# Patient Record
Sex: Male | Born: 1985 | Race: Black or African American | Hispanic: No | Marital: Married | Smoking: Never smoker
Health system: Southern US, Community
[De-identification: ages and names within clinical notes are randomized; demographics above are authoritative.]

## PROBLEM LIST (undated history)

## (undated) DIAGNOSIS — I10 Essential (primary) hypertension: Secondary | ICD-10-CM

---
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
## 2007-06-02 ENCOUNTER — Emergency Department (HOSPITAL_COMMUNITY): Admission: EM | Admit: 2007-06-02 | Discharge: 2007-06-03 | Payer: Self-pay | Admitting: Emergency Medicine

## 2010-10-11 LAB — INFLUENZA A+B VIRUS AG-DIRECT(RAPID)
Inflenza A Ag: NEGATIVE
Influenza B Ag: NEGATIVE

## 2010-10-11 LAB — RAPID STREP SCREEN (MED CTR MEBANE ONLY): Streptococcus, Group A Screen (Direct): NEGATIVE

## 2015-09-12 ENCOUNTER — Ambulatory Visit (INDEPENDENT_AMBULATORY_CARE_PROVIDER_SITE_OTHER): Payer: BLUE CROSS/BLUE SHIELD | Admitting: Family Medicine

## 2015-09-12 VITALS — BP 132/92 | HR 66 | Temp 98.1°F | Resp 16 | Ht 71.0 in | Wt 239.0 lb

## 2015-09-12 DIAGNOSIS — R51 Headache: Secondary | ICD-10-CM | POA: Diagnosis not present

## 2015-09-12 DIAGNOSIS — R519 Headache, unspecified: Secondary | ICD-10-CM

## 2015-09-12 MED ORDER — NAPROXEN 500 MG PO TABS
500.0000 mg | ORAL_TABLET | Freq: Two times a day (BID) | ORAL | 0 refills | Status: DC
Start: 2015-09-12 — End: 2016-03-19

## 2015-09-12 MED ORDER — TIZANIDINE HCL 6 MG PO CAPS: 6.0000 mg | ORAL_CAPSULE | Freq: Three times a day (TID) | ORAL | 0 refills | Status: DC

## 2015-09-12 NOTE — Patient Instructions (Addendum)
The headache clinic will call to schedule your appointment. Take as follows: . . tizanidine (ZANAFLEX) 6 MG capsule muscle relaxant for headache    Sig: Take 1 capsule (6 mg total) by mouth 3 (three) times daily.  . naproxen (NAPROSYN) 500 MG tablet anti-inflammatory for headache    Sig: Take 1 tablet (500 mg total) by mouth 2 (two) times daily with a meal.   Follow-up if headache still persists after 24 hours.  Godfrey PickKimberly S. Tiburcio PeaHarris, MSN, FNP-C [facility]  IF you received an x-ray today, you will receive an invoice from [facility]. Please contact [facility] at [phone] with questions or concerns regarding your invoice.   IF you received labwork today, you will receive an invoice from [facility]. Please contact [facility] at [phone] with questions or concerns regarding your invoice.   Our billing staff will not be able to assist you with questions regarding bills from these companies.  You will be contacted with the lab results as soon as they are available. The fastest way to get your results is to activate your My Chart account. Instructions are located on the last page of this paperwork. If you have not heard from us regarding the results in 2 weeks, please contact this office.     General Headache Without Cause A headache is pain or discomfort felt around the head or neck area. There are many causes and types of headaches. In some cases, the cause may not be found.  HOME CARE  Managing Pain  Take over-the-counter and prescription medicines only as told by your doctor.  Lie down in a dark, quiet room when you have a headache.  If directed, apply ice to the head and neck area:  Put ice in a plastic bag.  Place a towel between your skin and the bag.  Leave the ice on for 20 minutes, 2-3 times per day.  Use a heating pad or hot shower to apply heat to the head and neck area as told  by your doctor.  Keep lights dim if bright lights bother you or make your headaches worse. Eating and Drinking  Eat meals on a regular schedule.  Lessen how much alcohol you drink.  Lessen how much caffeine you drink, or stop drinking caffeine. General Instructions  Keep all follow-up visits as told by your doctor. This is important.  Keep a journal to find out if certain things bring on headaches. For example, write down:  What you eat and drink.  How much sleep you get.  Any change to your diet or medicines.  Relax by getting a massage or doing other relaxing activities.  Lessen stress.  Sit up straight. Do not tighten (tense) your muscles.  Do not use tobacco products. This includes cigarettes, chewing tobacco, or e-cigarettes. If you need help quitting, ask your doctor.  Exercise regularly as told by your doctor.  Get enough sleep. This often means 7-9 hours of sleep. GET HELP IF:  Your symptoms are not helped by medicine.  You have a headache that feels different than the other headaches.  You feel sick to your stomach (nauseous) or you throw up (vomit).  You have a fever. GET HELP RIGHT AWAY IF:   Your headache becomes really bad.  You keep throwing up.  You have a stiff neck.  You have trouble seeing.  You have trouble speaking.  You have pain in the eye or ear.  Your  muscles are weak or you lose muscle control.  You lose your balance or have trouble walking.  You feel like you will pass out (faint) or you pass out.  You have confusion.   This information is not intended to replace advice given to you by your health care provider. Make sure you discuss any questions you have with your health care provider.   Document Released: 10/11/2007 Document Revised: 09/22/2014 Document Reviewed: 04/26/2014 Elsevier Interactive Patient Education [employer]

## 2015-09-12 NOTE — Progress Notes (Signed)
Patient ID: Gary Soto, male    DOB: 1985-03-26, 30 y.o.   MRN: [id number]  PCP: No primary care provider on file.  Chief Complaint  Patient presents with  . Headache    x 2 days    Subjective:   HPI Presents for evaluation of headache.  30 year old male presents with a headache 2 nights.   He reports that his headache started as a mild ache and has progressively gotten worse while taking taking excedrin migraine.  He reports that he normally takes excedrin migraine anytime he gets a headache and it normally alleviates it. He reports a sensitivity to light, noise, and odors.  He reports the headache is located in right temporal region and has a pulsating sensation. He reports nausea without vomiting. He reports having a decreased appetite compared to his normal appetite. Describes lightheadedness but no real dizziness. No changes in speech or tremors.  Patient reports that he has been having these headaches for several years and has never had a complete workup for his headaches.  . Social History   Social History  . Marital status: Single    Spouse name: N/A  . Number of children: N/A  . Years of education: N/A   Occupational History  . Not on file.   Social History Main Topics  . Smoking status: Never Smoker  . Smokeless tobacco: Never Used  . Alcohol use Yes     Comment: once alcoholic beverage per week  . Drug use: No  . Sexual activity: Not on file   Other Topics Concern  . Not on file   Social History Narrative  . No narrative on file    . Family History  Problem Relation Age of Onset  . Hypertension Mother   . Hyperlipidemia Mother   . Hypertension Maternal Grandmother   . Hyperlipidemia Maternal Grandmother   . Diabetes Maternal Grandmother   . Heart disease Maternal Grandmother    Review of Systems  Constitutional: Positive for appetite change and fatigue.  Respiratory: Negative.   Cardiovascular: Negative.   Neurological: Positive for  light-headedness and headaches.    There are no active problems to display for this patient.    Prior to Admission medications   Not on File     No Known Allergies     Objective:  Physical Exam  Constitutional: He is oriented to person, place, and time. He appears well-developed and well-nourished.  HENT:  Head: Normocephalic and atraumatic.  Right Ear: External ear normal.  Left Ear: External ear normal.  Eyes: Conjunctivae and EOM are normal. Pupils are equal, round, and reactive to light.  Neck: Normal range of motion. Neck supple.  Cardiovascular: Normal rate, regular rhythm, normal heart sounds and intact distal pulses.   Pulmonary/Chest: Breath sounds normal.  Musculoskeletal: Normal range of motion.  Neurological: He is alert and oriented to person, place, and time. He has normal reflexes.  Cerebellar function intact. Negative for nystagmus  Skin: Skin is warm and dry.  Psychiatric: He has a normal mood and affect. His behavior is normal. Judgment and thought content normal.    Vitals:   09/12/15 1631  BP: (!) 132/92  Pulse: 66  Resp: 16  Temp: 98.1 F (36.7 C)   Assessment & Plan:  .1. Headache, unspecified headache type - AMB referral to headache clinic . . tizanidine (ZANAFLEX) 6 MG capsule    Sig: Take 1 capsule (6 mg total) by mouth 3 (three) times daily.  . naproxen (NAPROSYN) 500  MG tablet    Sig: Take 1 tablet (500 mg total) by mouth 2 (two) times daily with a meal.   Follow-up as needed.  Return for care if headache persists beyond 24 hours adhering  to prescribed regimen.  Godfrey PickKimberly S. Tiburcio PeaHarris, MSN, FNP-C [facility]

## 2015-11-12 ENCOUNTER — Emergency Department (HOSPITAL_COMMUNITY)
Admission: EM | Admit: 2015-11-12 | Discharge: 2015-11-13 | Disposition: A | Discharge status: Home / Self Care | Payer: Self-pay | Attending: Emergency Medicine | Admitting: Emergency Medicine

## 2015-11-12 DIAGNOSIS — F1994 Other psychoactive substance use, unspecified with psychoactive substance-induced mood disorder: Secondary | ICD-10-CM

## 2015-11-12 DIAGNOSIS — T50905A Adverse effect of unspecified drugs, medicaments and biological substances, initial encounter: Secondary | ICD-10-CM

## 2015-11-12 DIAGNOSIS — Z79899 Other long term (current) drug therapy: Secondary | ICD-10-CM | POA: Insufficient documentation

## 2015-11-12 DIAGNOSIS — T887XXA Unspecified adverse effect of drug or medicament, initial encounter: Secondary | ICD-10-CM | POA: Insufficient documentation

## 2015-11-12 DIAGNOSIS — R45851 Suicidal ideations: Secondary | ICD-10-CM | POA: Insufficient documentation

## 2015-11-12 DIAGNOSIS — F919 Conduct disorder, unspecified: Secondary | ICD-10-CM | POA: Insufficient documentation

## 2015-11-12 DIAGNOSIS — T407X5A Adverse effect of cannabis (derivatives), initial encounter: Secondary | ICD-10-CM | POA: Insufficient documentation

## 2015-11-12 DIAGNOSIS — Y829 Unspecified medical devices associated with adverse incidents: Secondary | ICD-10-CM | POA: Insufficient documentation

## 2015-11-12 LAB — RAPID URINE DRUG SCREEN, HOSP PERFORMED
Amphetamines: NOT DETECTED
BARBITURATES: NOT DETECTED
BENZODIAZEPINES: NOT DETECTED
COCAINE: NOT DETECTED
Opiates: NOT DETECTED
TETRAHYDROCANNABINOL: POSITIVE — AB

## 2015-11-12 NOTE — ED Provider Notes (Addendum)
[facility]-EMERGENCY DEPT Provider Note   CSN: [id number] Arrival date & time: 11/12/15  2208     History   Chief Complaint Chief Complaint  Patient presents with  . Suicidal    HPI Gary Soto is a 30 y.o. male. He presents with abnormal behavior after ingesting a marijuana brownie.  Using marijuana once in the past and had euphoric effects with it. He did not hallucinate. He ate a brownie tonight, as did his wife. He states that he started having a bad reaction and feeling like things were "awful and terrible. Systolic he was going to die. He states he felt like he had things crawling all over him. He became very agitated and asked his wife to take the kids out of the house as he was afraid that he will hurt her over them. He remembers this. He does not recall thinking that he wanted to. He was just really concerned about his abnormal behavior he was experiencing from the marijuana.   Apparently his wife was really concerned about his agitation episode and is placed him under IVC. Patient now is awake alert communicated with me. States he feels better. States he feels very tired after the episode.     HPI  No past medical history on file.  There are no active problems to display for this patient.   No past surgical history on file.     Home Medications    Prior to Admission medications   Medication Sig Start Date End Date Taking? Authorizing Provider  naproxen (NAPROSYN) 500 MG tablet Take 1 tablet (500 mg total) by mouth 2 (two) times daily with a meal. 09/12/15   Doyle AskewKimberly Stephenia Harris, FNP  tizanidine (ZANAFLEX) 6 MG capsule Take 1 capsule (6 mg total) by mouth 3 (three) times daily. 09/12/15   Doyle AskewKimberly Stephenia Harris, FNP    Family History Family History  Problem Relation Age of Onset  . Hypertension Mother   . Hyperlipidemia Mother   . Hypertension Maternal Grandmother   . Hyperlipidemia Maternal Grandmother   . Diabetes Maternal Grandmother   . Heart  disease Maternal Grandmother     Social History Social History  Substance Use Topics  . Smoking status: Never Smoker  . Smokeless tobacco: Never Used  . Alcohol use Yes     Comment: once alcoholic beverage per week     Allergies   Review of patient's allergies indicates no known allergies.   Review of Systems Review of Systems  Constitutional: Negative for appetite change, chills, diaphoresis, fatigue and fever.  HENT: Negative for mouth sores, sore throat and trouble swallowing.   Eyes: Negative for visual disturbance.  Respiratory: Negative for cough, chest tightness, shortness of breath and wheezing.   Cardiovascular: Negative for chest pain.  Gastrointestinal: Negative for abdominal distention, abdominal pain, diarrhea, nausea and vomiting.  Endocrine: Negative for polydipsia, polyphagia and polyuria.  Genitourinary: Negative for dysuria, frequency and hematuria.  Musculoskeletal: Negative for gait problem.  Skin: Negative for color change, pallor and rash.  Neurological: Negative for dizziness, syncope, light-headedness and headaches.  Hematological: Does not bruise/bleed easily.  Psychiatric/Behavioral: Negative for behavioral problems and confusion.     Physical Exam Updated Vital Signs BP 152/92 (BP Location: Right Arm)   Pulse 92   Temp 98.4 F (36.9 C) (Oral)   Resp 18   SpO2 100%   Physical Exam  Constitutional: He is oriented to person, place, and time. He appears well-developed and well-nourished. No distress.  HENT:  Head: Normocephalic.  Eyes: Conjunctivae are normal. Pupils are equal, round, and reactive to light. No scleral icterus.  Neck: Normal range of motion. Neck supple. No thyromegaly present.  Cardiovascular: Normal rate and regular rhythm.  Exam reveals no gallop and no friction rub.   No murmur heard. Pulmonary/Chest: Effort normal and breath sounds normal. No respiratory distress. He has no wheezes. He has no rales.  Abdominal: Soft.  Bowel sounds are normal. He exhibits no distension. There is no tenderness. There is no rebound.  Musculoskeletal: Normal range of motion.  Neurological: He is alert and oriented to person, place, and time.  Skin: Skin is warm and dry. No rash noted.  Psychiatric: He has a normal mood and affect. His behavior is normal.     ED Treatments / Results  Labs (all labs ordered are listed, but only abnormal results are displayed) Labs Reviewed  RAPID URINE DRUG SCREEN, HOSP PERFORMED - Abnormal; Notable for the following:       Result Value   Tetrahydrocannabinol POSITIVE (*)    All other components within normal limits    EKG  EKG Interpretation None       Radiology No results found.  Procedures Procedures (including critical care time)  Medications Ordered in ED Medications - No data to display   Initial Impression / Assessment and Plan / ED Course  I have reviewed the triage vital signs and the nursing notes.  Pertinent labs & imaging results that were available during my care of the patient were reviewed by me and considered in my medical decision making (see chart for details).  Clinical Course    Only on his toxicology. We'll asked for screening labs and TTS evaluation. Patient seems clear and does not currently meet criteria for involuntary hold on my evaluation. I feel this was drug effect which has passed  Final Clinical Impressions(s) / ED Diagnoses   Final diagnoses:  Adverse effect of drug, initial encounter    New Prescriptions New Prescriptions   No medications on file     Patient seen and evaluated by a change Center in agreement patient had an adverse drug reaction and meets no criteria for inpatient treatment at this time. He is awake alert, lucid oriented denies being homicidal or suicidal.  Rolland PorterMark Trinaty Bundrick, MD 11/12/15 2356    Rolland PorterMark Khamryn Calderone, MD 11/13/15 [phone]

## 2015-11-12 NOTE — ED Notes (Signed)
Bed: [id number] Expected date:  Expected time:  Means of arrival:  Comments: 30yo SI

## 2015-11-12 NOTE — ED Notes (Signed)
Per EMS- Pt ate brownies with unknown drugs in them, wanted to hurt himself and others. Conscious but nonverbal.

## 2015-11-13 DIAGNOSIS — Z833 Family history of diabetes mellitus: Secondary | ICD-10-CM

## 2015-11-13 DIAGNOSIS — F1994 Other psychoactive substance use, unspecified with psychoactive substance-induced mood disorder: Secondary | ICD-10-CM

## 2015-11-13 DIAGNOSIS — Z8249 Family history of ischemic heart disease and other diseases of the circulatory system: Secondary | ICD-10-CM

## 2015-11-13 DIAGNOSIS — Z79899 Other long term (current) drug therapy: Secondary | ICD-10-CM

## 2015-11-13 DIAGNOSIS — Z8349 Family history of other endocrine, nutritional and metabolic diseases: Secondary | ICD-10-CM

## 2015-11-13 LAB — ETHANOL: Alcohol, Ethyl (B): <5 mg/dL (ref <5)

## 2015-11-13 LAB — CBC WITH DIFFERENTIAL/PLATELET
BASOS PCT: 0 %
Basophils Absolute: 0 10*3/uL (ref 0.0–0.1)
EOS ABS: 0 10*3/uL (ref 0.0–0.7)
Eosinophils Relative: 0 %
HEMATOCRIT: 42.6 % (ref 39.0–52.0)
HEMOGLOBIN: 14.9 g/dL (ref 13.0–17.0)
Lymphocytes Relative: 12 %
Lymphs Abs: 1.5 10*3/uL (ref 0.7–4.0)
MCH: 30 pg (ref 26.0–34.0)
MCHC: 35 g/dL (ref 30.0–36.0)
MCV: 85.7 fL (ref 78.0–100.0)
Monocytes Absolute: 0.7 10*3/uL (ref 0.1–1.0)
Monocytes Relative: 6 %
NEUTROS ABS: 9.8 10*3/uL — AB (ref 1.7–7.7)
NEUTROS PCT: 82 %
Platelets: 216 10*3/uL (ref 150–400)
RBC: 4.97 MIL/uL (ref 4.22–5.81)
RDW: 13 % (ref 11.5–15.5)
WBC: 11.9 10*3/uL — AB (ref 4.0–10.5)

## 2015-11-13 LAB — URINALYSIS, ROUTINE W REFLEX MICROSCOPIC
GLUCOSE, UA: 100 mg/dL — AB
Hgb urine dipstick: NEGATIVE
Ketones, ur: NEGATIVE mg/dL
Leukocytes, UA: NEGATIVE
NITRITE: NEGATIVE
PH: 6 (ref 5.0–8.0)
Protein, ur: 30 mg/dL — AB
SPECIFIC GRAVITY, URINE: 1.036 — AB (ref 1.005–1.030)

## 2015-11-13 LAB — COMPREHENSIVE METABOLIC PANEL
ALBUMIN: 4.3 g/dL (ref 3.5–5.0)
ALK PHOS: 51 U/L (ref 38–126)
ALT: 95 U/L — AB (ref 17–63)
ANION GAP: 6 (ref 5–15)
AST: 125 U/L — AB (ref 15–41)
BILIRUBIN TOTAL: 0.8 mg/dL (ref 0.3–1.2)
BUN: 17 mg/dL (ref 6–20)
CALCIUM: 9 mg/dL (ref 8.9–10.3)
CO2: 25 mmol/L (ref 22–32)
CREATININE: 1.23 mg/dL (ref 0.61–1.24)
Chloride: 106 mmol/L (ref 101–111)
GFR calc Af Amer: >60 mL/min (ref >60)
GFR calc non Af Amer: >60 mL/min (ref >60)
GLUCOSE: 117 mg/dL — AB (ref 65–99)
Potassium: 4.3 mmol/L (ref 3.5–5.1)
SODIUM: 137 mmol/L (ref 135–145)
Total Protein: 7.5 g/dL (ref 6.5–8.1)

## 2015-11-13 LAB — URINE MICROSCOPIC-ADD ON

## 2015-11-13 NOTE — ED Notes (Signed)
Pt ambulatory and discharged home with wife. All personal belongings returned to pt. Follow up instructions as well as resources given with pt verbalizing understanding. Pt encouraged to return for SI/HI or changes/worsening in condition. Contracted for safety.

## 2015-11-13 NOTE — ED Notes (Signed)
OP resources give to pt by CSW

## 2015-11-13 NOTE — Progress Notes (Signed)
CSW spoke with patient at beside. CSW discussed substance abuse treatment options with patient, specifically informing patient about [facility]. CSW inquired if patient had any questions or concerns, patient replied no. CSW provided patient with substance abuse treatment options handout with contact information for facilities discussed and additional facilities within the area.

## 2015-11-13 NOTE — ED Notes (Signed)
SBAR Report received from previous nurse. Pt received calm and visible on unit.  Pt reminded of camera surveillance, q 15 min rounds, and rules of the milieu. Pt screened for contraband by Clinical research associatewriter, will endorse to day shift.

## 2015-11-13 NOTE — ED Notes (Signed)
Up to call for a ride

## 2015-11-13 NOTE — ED Notes (Signed)
Pt's wife unable to pick him up until later today,  But will try find someone else to pick him up

## 2015-11-13 NOTE — ED Notes (Signed)
Pt's wife is here to pick  Him up

## 2015-11-13 NOTE — ED Notes (Signed)
Pt belongings in bag and wanded by security
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
## 2015-11-13 NOTE — [facility] Suicide Risk Assessment (Signed)
Suicide Risk Assessment  Discharge Assessment   [facility]   Principal Problem: Substance induced mood disorder [facility](HCC) Discharge Diagnoses:  Patient Active Problem List   Diagnosis Date Noted  . Substance induced mood disorder (HCC) [F19.94] 11/13/2015    Total Time spent with patient: 15 minutes  Musculoskeletal: Strength & Muscle Tone: within normal limits Gait & Station: normal Patient leans: N/A  Psychiatric Specialty Exam:   Blood pressure 131/98, pulse 69, temperature 98 F (36.7 C), temperature source Oral, resp. rate 20, SpO2 100 %.There is no height or weight on file to calculate BMI.   General Appearance: Fairly Groomed  Eye Contact:  Good  Speech:  Clear and Coherent and Normal Rate  Volume:  Normal  Mood:  Anxious  Affect:  Congruent  Thought Process:  Coherent and Goal Directed  Orientation:  Full (Time, Place, and Person)  Thought Content:  Logical  Suicidal Thoughts:  No  Homicidal Thoughts:  No  Memory:  Immediate;   Good Recent;   Good  Judgement:  Intact  Insight:  Present  Psychomotor Activity:  Normal  Concentration:  Concentration: Fair and Attention Span: Fair  Recall:  Good  Fund of Knowledge:  Good  Language:  Good  Akathisia:  No  Handed:  Right  AIMS (if indicated):     Assets:  Communication Skills Desire for Improvement Physical Health Resilience  ADL's:  Intact  Cognition:  WNL  Sleep:      Mental Status Per Nursing Assessment::   On Admission:   suicidal ideation  Demographic Factors:  Male  Loss Factors: NA  Historical Factors: NA  Risk Reduction Factors:   Responsible for children under 29 years of age, Sense of responsibility to family and Living with another person, especially a relative  Continued Clinical Symptoms:  Alcohol/Substance Abuse/Dependencies  Cognitive Features That Contribute To Risk:  None    Suicide Risk:  Minimal: No identifiable suicidal ideation.  Patients  presenting with no risk factors but with morbid ruminations; may be classified as minimal risk based on the severity of the depressive symptoms     Plan Of Care/Follow-up recommendations:  Activity:  as tolerated Diet:  regular Tests:  as determined by PCP   Alberteen SamFran Camarie Mctigue, FNP-BC [facility] 11/13/2015, 11:33 AM

## 2015-11-13 NOTE — Consult Note (Signed)
[facility] Consult   Reason for Consult:  Psychiatric Evaluation Referring Physician:  EDP Patient Identification: Domanic Matusek MRN:  [id number] Principal Diagnosis: Substance induced mood disorder ([place]) Diagnosis:   Patient Active Problem List   Diagnosis Date Noted  . Substance induced mood disorder [facility]) [F19.94] 11/13/2015    Total Time spent with patient: 45 minutes  Subjective:   Marquan Vokes is a 30 y.o. male patient who states "I ate too much pot brownies."   Per therapeutic triage assessment, Nicholous Girgenti is an 30 y.o. male presenting to [facility] after having an adverse reaction to marijuana. Pt stated "I had some bad drugs". "I was having thoughts of committing suicide, killing others and having stuff crawl on me". Pt denies SI, HI and AVH at this time. Pt reported that he attempted suicide in the past but did not report any psychiatric hospitalization. Pt reported that he was evaluated in the ED after that attempt and discharged a few hours later. No current psychiatric treatment reported. Pt reported that he is dealing with the stress of having two kids being born back to back. Pt reported that he smokes marijuana several times a year and drinks 1/2 glass of wine several times a week. No physical, sexual or emotional abuse reported.  Pt does not meet inpatient criteria at this time. Pt denies SI, HI and AVH at this time and does not appear to be a danger to self or others.   Evaluation on the unit: Dravon ounces a 30 year old African-American male who presented to [facility] emergency department for evaluation of suicidal ideation. He is seen face-to-face today with Dr. Darleene Cleaver. The patient's suicidal ideation began after ingesting brownies laced with marijuana. He states he uses marijuana occasionally; urine drug screen is positive for THC. He reports a prior suicide attempt 4 years ago when he overdosed on pills. There was no inpatient psychiatric  hospitalization after this attempt, he remained overnight in an ED in [place]. He denies prior inpatient psychiatric hospitalizations. He lives with his wife and children. Today he denies suicidal ideation, intent or plan. He denies homicidal ideation, intent or plan. He denies AVH.   Past Psychiatric History: none reported  Risk to Self: Suicidal Ideation: No Suicidal Intent: No Is patient at risk for suicide?: No Suicidal Plan?: No Access to Means: No What has been your use of drugs/alcohol within the last 12 months?: THC and alcohol use reported.  How many times?: 1 Other Self Harm Risks: Pt denies  Triggers for Past Attempts: Unpredictable Intentional Self Injurious Behavior: None Risk to Others: Homicidal Ideation: No Thoughts of Harm to Others: No Current Homicidal Intent: No Current Homicidal Plan: No Access to Homicidal Means: No Identified Victim: N/A History of harm to others?: No Assessment of Violence: None Noted Violent Behavior Description: No violent behaviors observed at this time.  Does patient have access to weapons?: No Criminal Charges Pending?: Yes Describe Pending Criminal Charges: DWLR NOT IMPAIRED  REV  Does patient have a court date: Yes Court Date: 11/16/15 Prior Inpatient Therapy: Prior Inpatient Therapy: No Prior Outpatient Therapy: Prior Outpatient Therapy: No Does patient have an ACCT team?: No Does patient have Intensive In-House Services?  : No Does patient have [facility] services? : No Does patient have P4CC services?: No  Past Medical History: No past medical history on file. No past surgical history on file. Family History:  Family History  Problem Relation Age of Onset  . Hypertension Mother   . Hyperlipidemia Mother   .  Hypertension Maternal Grandmother   . Hyperlipidemia Maternal Grandmother   . Diabetes Maternal Grandmother   . Heart disease Maternal Grandmother    Family Psychiatric  History: unknown Social History:  History   Alcohol Use  . Yes    Comment: once alcoholic beverage per week     History  Drug Use No    Social History   Social History  . Marital status: Single    Spouse name: N/A  . Number of children: N/A  . Years of education: N/A   Social History Main Topics  . Smoking status: Never Smoker  . Smokeless tobacco: Never Used  . Alcohol use Yes     Comment: once alcoholic beverage per week  . Drug use: No  . Sexual activity: Not on file   Other Topics Concern  . Not on file   Social History Narrative  . No narrative on file   Additional Social History:    Allergies:  No Known Allergies  Labs:  Results for orders placed or performed during the hospital encounter of 11/12/15 (from the past 48 hour(s))  Rapid urine drug screen (hospital performed)     Status: Abnormal   Collection Time: 11/12/15 10:24 PM  Result Value Ref Range   Opiates NONE DETECTED NONE DETECTED   Cocaine NONE DETECTED NONE DETECTED   Benzodiazepines NONE DETECTED NONE DETECTED   Amphetamines NONE DETECTED NONE DETECTED   Tetrahydrocannabinol POSITIVE (A) NONE DETECTED   Barbiturates NONE DETECTED NONE DETECTED    Comment:        DRUG SCREEN FOR MEDICAL PURPOSES ONLY.  IF CONFIRMATION IS NEEDED FOR ANY PURPOSE, NOTIFY LAB WITHIN 5 DAYS.        LOWEST DETECTABLE LIMITS FOR URINE DRUG SCREEN Drug Class       Cutoff (ng/mL) Amphetamine      1000 Barbiturate      200 Benzodiazepine   491 Tricyclics       791 Opiates          300 Cocaine          300 THC              50   Urinalysis, Routine w reflex microscopic (not at [facility])     Status: Abnormal   Collection Time: 11/12/15 10:24 PM  Result Value Ref Range   Color, Urine AMBER (A) YELLOW    Comment: BIOCHEMICALS MAY BE AFFECTED BY COLOR   APPearance TURBID (A) CLEAR   Specific Gravity, Urine 1.036 (H) 1.005 - 1.030   pH 6.0 5.0 - 8.0   Glucose, UA 100 (A) NEGATIVE mg/dL   Hgb urine dipstick NEGATIVE NEGATIVE   Bilirubin Urine SMALL (A)  NEGATIVE   Ketones, ur NEGATIVE NEGATIVE mg/dL   Protein, ur 30 (A) NEGATIVE mg/dL   Nitrite NEGATIVE NEGATIVE   Leukocytes, UA NEGATIVE NEGATIVE  Urine microscopic-add on     Status: Abnormal   Collection Time: 11/12/15 10:24 PM  Result Value Ref Range   Squamous Epithelial / LPF 0-5 (A) NONE SEEN   WBC, UA 0-5 0 - 5 WBC/hpf   RBC / HPF 0-5 0 - 5 RBC/hpf   Bacteria, UA FEW (A) NONE SEEN   Urine-Other AMORPHOUS URATES/PHOSPHATES   CBC with Differential/Platelet     Status: Abnormal   Collection Time: 11/13/15 12:41 AM  Result Value Ref Range   WBC 11.9 (H) 4.0 - 10.5 K/uL   RBC 4.97 4.22 - 5.81 MIL/uL   Hemoglobin 14.9 13.0 -  17.0 g/dL   HCT 42.6 39.0 - 52.0 %   MCV 85.7 78.0 - 100.0 fL   MCH 30.0 26.0 - 34.0 pg   MCHC 35.0 30.0 - 36.0 g/dL   RDW 13.0 11.5 - 15.5 %   Platelets 216 150 - 400 K/uL   Neutrophils Relative % 82 %   Neutro Abs 9.8 (H) 1.7 - 7.7 K/uL   Lymphocytes Relative 12 %   Lymphs Abs 1.5 0.7 - 4.0 K/uL   Monocytes Relative 6 %   Monocytes Absolute 0.7 0.1 - 1.0 K/uL   Eosinophils Relative 0 %   Eosinophils Absolute 0.0 0.0 - 0.7 K/uL   Basophils Relative 0 %   Basophils Absolute 0.0 0.0 - 0.1 K/uL  Comprehensive metabolic panel     Status: Abnormal   Collection Time: 11/13/15 12:41 AM  Result Value Ref Range   Sodium 137 135 - 145 mmol/L   Potassium 4.3 3.5 - 5.1 mmol/L   Chloride 106 101 - 111 mmol/L   CO2 25 22 - 32 mmol/L   Glucose, Bld 117 (H) 65 - 99 mg/dL   BUN 17 6 - 20 mg/dL   Creatinine, Ser 1.23 0.61 - 1.24 mg/dL   Calcium 9.0 8.9 - 10.3 mg/dL   Total Protein 7.5 6.5 - 8.1 g/dL   Albumin 4.3 3.5 - 5.0 g/dL   AST 125 (H) 15 - 41 U/L   ALT 95 (H) 17 - 63 U/L   Alkaline Phosphatase 51 38 - 126 U/L   Total Bilirubin 0.8 0.3 - 1.2 mg/dL   GFR calc non Af Amer >60 >60 mL/min   GFR calc Af Amer >60 >60 mL/min    Comment: (NOTE) The eGFR has been calculated using the CKD EPI equation. This calculation has not been validated in all clinical  situations. eGFR's persistently <60 mL/min signify possible Chronic Kidney Disease.    Anion gap 6 5 - 15  Ethanol     Status: None   Collection Time: 11/13/15 12:42 AM  Result Value Ref Range   Alcohol, Ethyl (B) <5 <5 mg/dL    Comment:        LOWEST DETECTABLE LIMIT FOR SERUM ALCOHOL IS 5 mg/dL FOR MEDICAL PURPOSES ONLY     No current facility-administered medications for this encounter.    Current Outpatient Prescriptions  Medication Sig Dispense Refill  . naproxen (NAPROSYN) 500 MG tablet Take 1 tablet (500 mg total) by mouth 2 (two) times daily with a meal. (Patient not taking: Reported on 11/13/2015) 30 tablet 0  . tizanidine (ZANAFLEX) 6 MG capsule Take 1 capsule (6 mg total) by mouth 3 (three) times daily. (Patient not taking: Reported on 11/13/2015) 30 capsule 0    Musculoskeletal: Strength & Muscle Tone: within normal limits Gait & Station: normal Patient leans: N/A  Psychiatric Specialty Exam: Physical Exam  Nursing note and vitals reviewed.   Review of Systems  Constitutional: Negative.   HENT: Negative.   Eyes: Negative.   Respiratory: Negative.   Cardiovascular: Negative.   Gastrointestinal: Negative.   Genitourinary: Negative.   Musculoskeletal: Negative.   Skin: Negative.   Neurological: Negative.   Endo/Heme/Allergies: Negative.   Psychiatric/Behavioral: Positive for substance abuse.    Blood pressure 131/98, pulse 69, temperature 98 F (36.7 C), temperature source Oral, resp. rate 20, SpO2 100 %.There is no height or weight on file to calculate BMI.  General Appearance: Fairly Groomed  Eye Contact:  Good  Speech:  Clear and Coherent and Normal  Rate  Volume:  Normal  Mood:  Anxious  Affect:  Congruent  Thought Process:  Coherent and Goal Directed  Orientation:  Full (Time, Place, and Person)  Thought Content:  Logical  Suicidal Thoughts:  No  Homicidal Thoughts:  No  Memory:  Immediate;   Good Recent;   Good  Judgement:  Intact  Insight:   Present  Psychomotor Activity:  Normal  Concentration:  Concentration: Fair and Attention Span: Fair  Recall:  Good  Fund of Knowledge:  Good  Language:  Good  Akathisia:  No  Handed:  Right  AIMS (if indicated):     Assets:  Communication Skills Desire for Improvement Physical Health Resilience  ADL's:  Intact  Cognition:  WNL  Sleep:       Case discussed with Dr. Darleene Cleaver; recommendations are: Disposition: No evidence of imminent risk to self or others at present.   Patient does not meet criteria for psychiatric inpatient admission. Supportive therapy provided about ongoing stressors.  Refer to ADS for outpatient follow-up.   Serena Colonel, FNP-BC [place] 11/13/2015 11:29 AM  Patient seen face-to-face for psychiatric evaluation, chart reviewed and case discussed with the physician extender and developed treatment plan. Reviewed the information documented and agree with the treatment plan. Corena Pilgrim, MD

## 2015-11-13 NOTE — ED Notes (Signed)
Unsuccessful attempt to draw labs. Nurse informed. 

## 2015-11-13 NOTE — BH Assessment (Signed)
Assessment completed. Consulted Nira ConnJason Berry, FNP who agrees that pt does not meet inpatient criteria. Pt denies SI, HI and psychosis at this time. Informed Elpidio AnisShari Upstill, PA-C of the recommendation.

## 2016-03-19 ENCOUNTER — Ambulatory Visit (INDEPENDENT_AMBULATORY_CARE_PROVIDER_SITE_OTHER): Payer: BLUE CROSS/BLUE SHIELD | Admitting: Physician Assistant

## 2016-03-19 VITALS — BP 118/76 | HR 67 | Temp 99.0°F | Resp 18 | Ht 71.0 in | Wt 231.0 lb

## 2016-03-19 DIAGNOSIS — M545 Low back pain, unspecified: Secondary | ICD-10-CM

## 2016-03-19 DIAGNOSIS — G8929 Other chronic pain: Secondary | ICD-10-CM | POA: Diagnosis not present

## 2016-03-19 DIAGNOSIS — M62830 Muscle spasm of back: Secondary | ICD-10-CM | POA: Diagnosis not present

## 2016-03-19 DIAGNOSIS — Z87898 Personal history of other specified conditions: Secondary | ICD-10-CM

## 2016-03-19 DIAGNOSIS — F1291 Cannabis use, unspecified, in remission: Secondary | ICD-10-CM

## 2016-03-19 MED ORDER — MELOXICAM 15 MG PO TABS: 15.0000 mg | ORAL_TABLET | Freq: Every day | ORAL | 1 refills | Status: DC

## 2016-03-19 NOTE — Progress Notes (Signed)
Gary Soto  MRN: [id number] DOB: 1985-08-31  PCP: No primary care provider on file.  Subjective:  Pt is a 31 year old male PMH suicidal ideation, substance induced mood disorder, headaches, who presents to clinic for chronic back pain x 5 years.   Last OV was in the emergency department 10/2015  For substance induced mood disorder after ingesting a marijuana brownie. He came in today due to worsening symptoms: his baby was crying in his crib one night last week, pt could not stand up straight to pick up his baby. Pt had to kneel on his knees and feed baby still in crib. This is concerning to him and he finally came in to be seen.  Pain was exacerbated 5 days ago after he picked up an extra shift at work - he was lifting boxes.  Pain is described as "sharp" pain in left low back. Happens while his wife is pregnant - last year and the year prior. Comes and goes. He though it was "sympathetic pain" with his wife being pregnant.  His job makes it worse. He is a Production designer, theatre/television/filmmanager, loading and unloading trucks.  Left lower back. Does not radiate. 8/10 pain. "feels like stabbing" in his back when it's bad.  Worse when he first wakes up - has to get on his knees and crawl. Lays down and stretches it - this helps. He has to sleep in a recliner. Laying flat makes it worse. Feels better with walking - he walks all day at work.  Denies bony tenderness, n/t LES, saddle paresthesia, muscle weakness, reduced ROM.   Review of Systems  Constitutional: Negative for chills, diaphoresis and fever.  Respiratory: Negative for cough, chest tightness, shortness of breath and wheezing.   Cardiovascular: Negative for chest pain and palpitations.  Gastrointestinal: Negative for abdominal pain, diarrhea, nausea and vomiting.  Musculoskeletal: Positive for back pain. Negative for neck pain.  Neurological: Negative for dizziness, syncope, light-headedness and headaches.  Psychiatric/Behavioral: Positive for sleep disturbance.     Patient Active Problem List   Diagnosis Date Noted  . Substance induced mood disorder (HCC) 11/13/2015    Current Outpatient Prescriptions on File Prior to Visit  Medication Sig Dispense Refill  . tizanidine (ZANAFLEX) 6 MG capsule Take 1 capsule (6 mg total) by mouth 3 (three) times daily. (Patient not taking: Reported on 11/13/2015) 30 capsule 0   No current facility-administered medications on file prior to visit.     No Known Allergies   Objective:  BP 118/76 (BP Location: Right Arm, Patient Position: Sitting, Cuff Size: Small)   Pulse 67   Temp 99 F (37.2 C) (Oral)   Resp 18   Ht 5\' 11"  (1.803 m)   Wt 231 lb (104.8 kg)   SpO2 99%   BMI 32.22 kg/m   Physical Exam  Constitutional: He is oriented to person, place, and time and well-developed, well-nourished, and in no distress. No distress.  Cardiovascular: Normal rate, regular rhythm and normal heart sounds.   Musculoskeletal:       Lumbar back: He exhibits decreased range of motion (with extension from hips) and tenderness (left lower back). He exhibits no bony tenderness, no deformity, no pain and no spasm.  Neurological: He is alert and oriented to person, place, and time. GCS score is 15.  Skin: Skin is warm and dry.  Psychiatric: Mood, memory, affect and judgment normal.  Vitals reviewed.   Assessment and Plan :  1. Chronic left-sided low back pain without sciatica 2.  Muscle spasm of back 3. History of marijuana use - meloxicam (MOBIC) 15 MG tablet; Take 1 tablet (15 mg total) by mouth daily.  Dispense: 30 tablet; Refill: 1 - Will not Rx Flexeril due to pt's h/o drug use. Pain is left side of back, no neuro involvement. Supportive care encouraged: Apply heat, stretches demonstrated and printed out for pt, walking daily, stay well hydrated, add Tylenol if needed. RTC in 4-6 weeks if no improvement, consider ortho or physical therapy referral.   Marco Collie, PA-C  [facility] at [facility] 03/19/2016 3:39 PM

## 2016-03-19 NOTE — Patient Instructions (Addendum)
Meloxicam - Take this once a day. Do not use with any other otc pain medication other than tylenol/acetaminophen - so no aleve, ibuprofen, motrin, advil, etc. You may also take 546m Tylenol with this.  Stay well hydrated - drink 2-3 liters of water every day.  Stay moving - try to walk 30 minutes most days of the week. It does not count if this is during work.  Perform stretches we went over in the clinic. Perform exercises below at 5 sets with 10 repetitions. Stretches are to be performed for 5 sets, 10 seconds each. Recommended perform this rehab twice daily within pain tolerance for 2 weeks. GET A HEATING PAD. Apply heat to affected area for 20-30 minutes 2-3 times a day. Do not put directly on bare skin.   If you are not better in 4-6 weeks, come back and we can refer you to physical therapy.   Thank you for coming in today. I hope you feel we met your needs.  Feel free to call [facility] if you have any questions or further requests.  Please consider signing up for MyChart if you do not already have it, as this is a great way to communicate with me.  Best,  Whitney McVey, PA-C   Low Back Strain With Rehab Low Back Strain Rehab Consulte al mdico qu ejercicios son seguros para usted. Haga los ejercicios exactamente como se lo haya indicado el mdico y gradelos como se lo hayan indicado. Es normal sentir un leve estiramiento, tirn, rigidez o molestia cuando haga estos ejercicios, pero debe detenerse de inmediato si siento un dolor repentino o si el dolor empeora. No comience a hacer estos ejercicios hasta que se lo indique el mdico. EJERCICIOS DE EFiservY AMPLITUD DE MOVIMIENTOS  Estos ejercicios calientan los msculos y las articulaciones, y mAledoy la flexibilidad de la espalda. Estos ejercicios tambin ayudan a aBest boy el adormecimiento y el hormigueo. Ejercicio A: Rodilla al pecho 1. Acustese boca arriba en una superficie firme con las piernas  extendidas. 2. Flexione una rodilla. Tome la rodilla con las manos y llvela hacia el pecho hasta que sienta un estiramiento suave en la parte inferior de la espalda y las nalgas  Mantenga la otra pierna lo ms extendida posible.  Mantenga la otra pierna lo ms extendida posible. 3. Mantenga esta posicin durante __________ segundos. 4. Vuelva lentamente a la posicin inicial. 5. Repita el ejercicio con la otra pierna. Repita __________ veces. Realice este ejercicio __________ veces al da. Ejercicio B: Extensin sArvinMeritorcodos, en decbito prono 1. Acustese boca abajo sobre una superficie firme. 2. Apyese sobre los codos. 3. Con los brazos, aydese a lCounselling psychologistsentir un leve estiramiento en el abdomen y la parte inferior de la espalda.  Esto colocar algo de pWalgreencodos. Si no se siente cmodo, intente colocando almohadas debajo del pecho.  Debe dejar la cadera inmvil sobre la superficie en la que est apoyado. Mantenga la cadera y los msculos de la espalda relajados. 4. Mantenga esta posicin durante __________ segundos. 5. Afloje lentamente la parte superior del cuerpo y vuelva a la posicin inicial. Repita __________ veces. Realice este ejercicio __________ veces al da. STRENGTHENING EXERCISES  Estos ejercicios fortalecen la espalda y le otorgan resistencia. La resistencia es la capacidad de usar los msculos durante un tiempo prolongado, incluso despus de que se cansen. Ejercicio C: Inclinacin de la pelvis 1. Acustese boca arriba sobre una superficie firme. FIsabel  rodillas y [place]. 2. Tensione los msculos abdominales. Eleve la pelvis hacia el techo y aplane la parte inferior de la espalda contra el suelo.  Para realizar este ejercicio, puede colocar una toalla pequea debajo de la parte inferior de la espalda y presionar la espalda contra la toalla. 3. Mantenga esta posicin durante __________ segundos. 4. Relaje totalmente  los msculos antes de repetir el ejercicio. Repita __________ veces. Realice este ejercicio __________ veces al da. Ejercicio D: Elevaciones alternadas de pierna y brazo 1. Quimby manos y las rodillas sobre una superficie firme. Si se colocar sobre una superficie muy dura, puede usar un elemento acolchado para apoyar las rodillas, como una alfombrilla para ejercicios. 2. Alinee los brazos y las piernas. Las manos deben estar debajo de los hombros y las rodillas debajo de la cadera. 3. Eleve la pierna izquierda hacia atrs. Al mismo tiempo, eleve el brazo derecho y Engineer, petroleum frente a usted.  No eleve la pierna por encima de la cadera.  No eleve el brazo por encima del hombro.  Mantenga los msculos del abdomen y de la espalda contrados.  Mantenga la cadera mirando hacia el suelo.  No arquee la espalda.  Mantenga el equilibrio con cuidado y no contenga la respiracin. 4. Mantenga esta posicin durante __________ segundos. 5. Lentamente regrese a la posicin inicial y repita el ejercicio con la pierna derecha y el brazo izquierdo. Repita __________ veces. Realice este ejercicio __________ veces al da. Ejercicio J: Bajar una pierna con rodillas flexionadas 1. Acustese boca arriba sobre una superficie firme. 2. Apriete los msculos abdominales y [employer] del piso, uno a la vez, de modo que las rodillas y la cadera estn flexionadas en forma de "L" (a aproximadamente 90 grados).  Las rodillas deben estar por encima de la cadera y las pantorrillas deben quedar paralelas al piso. 3. Con los msculos abdominales tensos y la rodilla flexionada, baje lentamente una pierna de modo que los dedos del pie toquen el suelo. 4. Levante la pierna para volver a la posicin inicial.  No contenga la respiracin.  No deje que la espalda se arquee. Mantenga la espalda plana contra el suelo. 5. Repita el ejercicio con la otra pierna. Repita __________ veces. Realice este  ejercicio __________ veces al da. Theotis Barrio [employer]  La Therapist, nutritional se refiere a los movimientos y a las posiciones del cuerpo mientras realiza las actividades diarias. La postura es una parte de la Therapist, nutritional. La buena postura y la Engineer, agricultural corporal saludable pueden ayudar a Theatre stage manager estrs en las articulaciones y los tejidos del cuerpo. La buena postura significa que la columna mantiene su posicin natural de curvatura en forma de S (la columna est en una posicin neutral), los hombros Lucianne Lei un poco hacia atrs y la cabeza no se inclina hacia adelante. A continuacin, se incluyen pautas generales para mejorar la postura y Quarry manager en las actividades diarias. De pie  Al estar de pie, mantenga la columna en la posicin neutral y los pies separados al ancho de caderas, aproximadamente. Mantenga las rodillas ligeramente flexionadas. Las [place], los hombros y las caderas deben estar alineados.  Cuando realice una tarea en la que deba estar de pie en el mismo sitio durante mucho tiempo, coloque un pie en un objeto estable de 2 a 4 pulgadas (5 a 10 cm) de alto, como un taburete. Esto ayuda a que la columna mantenga una posicin neutral. Sentado  Cuando est  sentado, mantenga la columna en posicin neutral y deje los pies apoyados en el suelo. Use un apoyapis, si es necesario, y [employer] muslos paralelos al suelo. Evite redondear los hombros e inclinar la cabeza hacia adelante.  Cuando trabaje en un escritorio o con una computadora, el escritorio debe estar a una altura en la que las manos estn un poco ms abajo que los codos. Deslice la silla debajo del escritorio, de modo de estar lo suficientemente cerca como para mantener una buena [place].  Cuando trabaje con una computadora, coloque el monitor a una altura que le permita mirar derecho hacia adelante, sin tener que inclinar la cabeza hacia adelante o [place] atrs. Reposo  Al descansar o estar acostado, evite  las posiciones que le causen ms dolor.  Si siente dolor al hacer actividades que exigen sentarse, inclinarse, agacharse o ponerse en cuclillas (actividades basadas en la flexin), acustese en una posicin en la que el cuerpo no deba doblarse mucho. Por ejemplo, evite acurrucarse de costado con los brazos y las rodillas cerca del pecho (posicin fetal).  Si siente dolor con las actividades que exigen estar de pie durante mucho tiempo o Training and development officer los brazos (actividades basadas en la extensin), acustese con la columna en una posicin neutral y flexione ligeramente las rodillas. Pruebe con las siguientes posiciones:  Acostarse de costado con una almohada entre las rodillas.  Acostarse boca arriba con una almohada debajo de las rodillas. Levantar objetos  Cuando tenga que levantar un objeto, mantenga los pies separados el ancho de los hombros y apriete los msculos abdominales.  [place] y la cadera, y Quarry manager la columna en posicin neutral. Es importante levantar utilizando la fuerza de las piernas, no de la espalda. No trabe las rodillas hacia afuera.  Siempre pida ayuda a otra persona para levantar objetos pesados o incmodos. Esta informacin no tiene Marine scientist el consejo del mdico. Asegrese de hacerle al mdico cualquier pregunta que tenga. Document Released: 10/18/2005 Document Revised: 05/18/2014 Document Reviewed: 10/13/2014 Elsevier Interactive Patient Education  2017 [employer].  IF you received an x-ray today, you will receive an invoice from [facility]. Please contact [facility] at [phone] with questions or concerns regarding your invoice.   IF you received labwork today, you will receive an invoice from [facility]. Please contact [facility] at [phone] with questions or concerns regarding your invoice.   Our billing staff will not be able to assist you with questions regarding bills from these companies.  You will be contacted  with the lab results as soon as they are available. The fastest way to get your results is to activate your My Chart account. Instructions are located on the last page of this paperwork. If you have not heard from [place] regarding the results in 2 weeks, please contact this office.

## 2016-05-06 ENCOUNTER — Encounter (HOSPITAL_COMMUNITY): Payer: Self-pay | Admitting: Emergency Medicine

## 2016-05-06 ENCOUNTER — Emergency Department (HOSPITAL_COMMUNITY)
Admission: EM | Admit: 2016-05-06 | Discharge: 2016-05-06 | Disposition: A | Discharge status: Home / Self Care | Payer: Self-pay | Attending: Dermatology | Admitting: Dermatology

## 2016-05-06 DIAGNOSIS — H5711 Ocular pain, right eye: Secondary | ICD-10-CM | POA: Insufficient documentation

## 2016-05-06 DIAGNOSIS — Z5321 Procedure and treatment not carried out due to patient leaving prior to being seen by health care provider: Secondary | ICD-10-CM | POA: Insufficient documentation

## 2016-05-06 NOTE — ED Triage Notes (Signed)
Pt reports pain and redness to right eye that has been ongoing for the last few days. Pt does report wearing contacts and that if he takes them out. Pt states that it feels likes something is scratching it.

## 2016-05-06 NOTE — ED Notes (Signed)
2nd appeal for pt.  No answer.

## 2016-05-06 NOTE — ED Notes (Signed)
Pt has had a "feeling like a rock is in his eye." With the contact lens in, it is less painful. Pt stated itchiness.

## 2016-05-06 NOTE — ED Notes (Signed)
Bed: [facility] Expected date:  Expected time:  Means of arrival:  Comments: 

## 2017-05-31 ENCOUNTER — Other Ambulatory Visit: Payer: Self-pay

## 2017-05-31 ENCOUNTER — Emergency Department (INDEPENDENT_AMBULATORY_CARE_PROVIDER_SITE_OTHER)
Admission: EM | Admit: 2017-05-31 | Discharge: 2017-05-31 | Disposition: A | Discharge status: Home / Self Care | Payer: Managed Care, Other (non HMO) | Source: Home / Self Care | Attending: Family Medicine | Admitting: Family Medicine

## 2017-05-31 DIAGNOSIS — Z202 Contact with and (suspected) exposure to infections with a predominantly sexual mode of transmission: Secondary | ICD-10-CM | POA: Diagnosis not present

## 2017-05-31 MED ORDER — CEFTRIAXONE SODIUM 250 MG IJ SOLR
250.0000 mg | Freq: Once | INTRAMUSCULAR | Status: AC
  Administered 2017-05-31: 250 mg via INTRAMUSCULAR

## 2017-05-31 MED ORDER — AZITHROMYCIN 250 MG PO TABS: 1000.0000 mg | ORAL_TABLET | Freq: Once | ORAL | 0 refills | Status: AC

## 2017-05-31 NOTE — Discharge Instructions (Signed)
°  Refrain from sexual intercourse for 7 days. Be sure to have all partners tested and treated for STDs.  Practice safe sex by always wearing condoms.  ° °
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
## 2017-06-01 LAB — HIV ANTIBODY (ROUTINE TESTING W REFLEX): HIV 1&2 Ab, 4th Generation: NONREACTIVE

## 2017-06-01 LAB — C. TRACHOMATIS/N. GONORRHOEAE RNA
C. trachomatis RNA, TMA: NOT DETECTED
N. gonorrhoeae RNA, TMA: NOT DETECTED

## 2017-06-03 ENCOUNTER — Telehealth: Payer: Self-pay | Admitting: Emergency Medicine

## 2017-06-03 LAB — RPR: RPR Ser Ql: NONREACTIVE

## 2017-09-13 ENCOUNTER — Other Ambulatory Visit: Payer: Self-pay

## 2017-09-13 ENCOUNTER — Inpatient Hospital Stay (HOSPITAL_COMMUNITY)
Admission: RE | Admit: 2017-09-13 | Discharge: 2017-09-18 | DRG: 885 | Disposition: A | Discharge status: Home / Self Care | Payer: 59 | Attending: Psychiatry | Admitting: Psychiatry

## 2017-09-13 ENCOUNTER — Encounter (HOSPITAL_COMMUNITY): Payer: Self-pay | Admitting: Behavioral Health

## 2017-09-13 DIAGNOSIS — F322 Major depressive disorder, single episode, severe without psychotic features: Secondary | ICD-10-CM | POA: Diagnosis not present

## 2017-09-13 DIAGNOSIS — G4700 Insomnia, unspecified: Secondary | ICD-10-CM | POA: Diagnosis present

## 2017-09-13 DIAGNOSIS — F332 Major depressive disorder, recurrent severe without psychotic features: Secondary | ICD-10-CM | POA: Diagnosis present

## 2017-09-13 DIAGNOSIS — F419 Anxiety disorder, unspecified: Secondary | ICD-10-CM | POA: Diagnosis present

## 2017-09-13 DIAGNOSIS — Z62810 Personal history of physical and sexual abuse in childhood: Secondary | ICD-10-CM | POA: Diagnosis present

## 2017-09-13 DIAGNOSIS — R45851 Suicidal ideations: Secondary | ICD-10-CM | POA: Diagnosis present

## 2017-09-13 DIAGNOSIS — F528 Other sexual dysfunction not due to a substance or known physiological condition: Secondary | ICD-10-CM | POA: Diagnosis present

## 2017-09-13 DIAGNOSIS — Z7251 High risk heterosexual behavior: Secondary | ICD-10-CM | POA: Diagnosis not present

## 2017-09-13 DIAGNOSIS — F1290 Cannabis use, unspecified, uncomplicated: Secondary | ICD-10-CM | POA: Diagnosis present

## 2017-09-13 DIAGNOSIS — F605 Obsessive-compulsive personality disorder: Secondary | ICD-10-CM | POA: Diagnosis present

## 2017-09-13 DIAGNOSIS — F1994 Other psychoactive substance use, unspecified with psychoactive substance-induced mood disorder: Secondary | ICD-10-CM

## 2017-09-13 DIAGNOSIS — R41843 Psychomotor deficit: Secondary | ICD-10-CM | POA: Diagnosis present

## 2017-09-13 DIAGNOSIS — Z818 Family history of other mental and behavioral disorders: Secondary | ICD-10-CM | POA: Diagnosis not present

## 2017-09-13 DIAGNOSIS — I10 Essential (primary) hypertension: Secondary | ICD-10-CM | POA: Diagnosis present

## 2017-09-13 LAB — RAPID URINE DRUG SCREEN, HOSP PERFORMED
AMPHETAMINES: NOT DETECTED
BARBITURATES: NOT DETECTED
Benzodiazepines: NOT DETECTED
Cocaine: NOT DETECTED
Opiates: NOT DETECTED
Tetrahydrocannabinol: NOT DETECTED

## 2017-09-13 MED ORDER — HYDROXYZINE HCL 25 MG PO TABS
25.0000 mg | ORAL_TABLET | Freq: Three times a day (TID) | ORAL | Status: DC | PRN
  Administered 2017-09-13: 25 mg via ORAL
  Filled 2017-09-13: qty 1

## 2017-09-13 MED ORDER — ALUM & MAG HYDROXIDE-SIMETH 200-200-20 MG/5ML PO SUSP: 30.0000 mL | ORAL | Status: DC | PRN

## 2017-09-13 MED ORDER — TRAZODONE HCL 50 MG PO TABS
50.0000 mg | ORAL_TABLET | Freq: Every evening | ORAL | Status: DC | PRN
  Administered 2017-09-13 – 2017-09-17 (×5): 50 mg via ORAL
  Filled 2017-09-13 (×5): qty 1

## 2017-09-13 MED ORDER — SERTRALINE HCL 25 MG PO TABS
25.0000 mg | ORAL_TABLET | Freq: Every day | ORAL | Status: DC
  Administered 2017-09-13 – 2017-09-14 (×2): 25 mg via ORAL
  Filled 2017-09-13 (×4): qty 1

## 2017-09-13 MED ORDER — ACETAMINOPHEN 325 MG PO TABS: 650.0000 mg | ORAL_TABLET | Freq: Four times a day (QID) | ORAL | Status: DC | PRN

## 2017-09-13 MED ORDER — MAGNESIUM HYDROXIDE 400 MG/5ML PO SUSP: 30.0000 mL | Freq: Every day | ORAL | Status: DC | PRN
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
## 2017-09-13 NOTE — [facility] Group Notes (Signed)
  [facility] LCSW Group Therapy Note  Date/Time: 09/13/17, 1315  Type of Therapy/Topic:  Group Therapy:  Emotion Regulation  Participation Level:  Active   Mood:pleasant  Description of Group:    The purpose of this group is to assist patients in learning to regulate negative emotions and experience positive emotions. Patients will be guided to discuss ways in which they have been vulnerable to their negative emotions. These vulnerabilities will be juxtaposed with experiences of positive emotions or situations, and patients challenged to use positive emotions to combat negative ones. Special emphasis will be placed on coping with negative emotions in conflict situations, and patients will process healthy conflict resolution skills.  Therapeutic Goals: 1. Patient will identify two positive emotions or experiences to reflect on in order to balance out negative emotions:  2. Patient will label two or more emotions that they find the most difficult to experience:  3. Patient will be able to demonstrate positive conflict resolution skills through discussion or role plays:   Summary of Patient Progress: Pt shared that anger and embarassment are emotions that are difficult to experience.  Pt made several good comments during group discussion regarding positive ways to deal with negative emotions.       Therapeutic Modalities:   Cognitive Behavioral Therapy Feelings Identification Dialectical Behavioral Therapy  Daleen SquibbGreg Onur Mori, LCSW
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
## 2017-09-13 NOTE — Tx Team (Signed)
Initial Treatment Plan 09/13/2017 11:19 AM Gary Bitterhomas Siebert ZOX:[id number]    PATIENT STRESSORS: Financial difficulties Marital or family conflict Substance abuse   PATIENT STRENGTHS: Ability for insight Capable of independent living Communication skills Motivation for treatment/growth Physical Health Special hobby/interest   PATIENT IDENTIFIED PROBLEMS: "getting himself right with my depression"  "gaining healthy coping skills"                   DISCHARGE CRITERIA:  Ability to meet basic life and health needs Adequate post-discharge living arrangements Motivation to continue treatment in a less acute level of care  PRELIMINARY DISCHARGE PLAN: Return to previous living arrangement  PATIENT/FAMILY INVOLVEMENT: This treatment plan has been presented to and reviewed with the patient, Gary Soto.  The patient and family have been given the opportunity to ask questions and make suggestions.  Raylene MiyamotoMichael R Tiesha Marich, RN 09/13/2017, 11:19 AM
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
## 2017-09-13 NOTE — Progress Notes (Signed)
Harm received Northrop GrummanVistaril tonight for some anxiety with good results. He also received Trazodone as requested for sleep. He reports feeling good because he is finally admitting he has a problem and seeking help. His wife was here to visit and patient reports this is the first time he has seen her in 3 days. Wife appeared supportive. Denies current suicidal thoughts.

## 2017-09-13 NOTE — H&P (Signed)
Behavioral Health Medical Screening Exam  Gary Soto is an 32 y.o. male.  Total Time spent with patient: 20 minutes  Psychiatric Specialty Exam: Physical Exam  Nursing note and vitals reviewed. Constitutional: He is oriented to person, place, and time. He appears well-developed and well-nourished.  Cardiovascular: Normal rate.  Respiratory: Effort normal.  Musculoskeletal: Normal range of motion.  Neurological: He is alert and oriented to person, place, and time.  Skin: Skin is warm.    Review of Systems  Constitutional: Negative.   HENT: Negative.   Eyes: Negative.   Respiratory: Negative.   Cardiovascular: Negative.   Gastrointestinal: Negative.   Genitourinary: Negative.   Musculoskeletal: Negative.   Skin: Negative.   Neurological: Negative.   Endo/Heme/Allergies: Negative.   Psychiatric/Behavioral: Positive for depression and suicidal ideas. The patient is nervous/anxious.     Blood pressure (!) 127/91, pulse 72, temperature 98.3 F (36.8 C), resp. rate 16, SpO2 100 %.There is no height or weight on file to calculate BMI.  General Appearance: Casual  Eye Contact:  Fair  Speech:  Clear and Coherent and Normal Rate  Volume:  Decreased  Mood:  Depressed  Affect:  Flat  Thought Process:  Linear and Descriptions of Associations: Intact  Orientation:  Full (Time, Place, and Person)  Thought Content:  WDL  Suicidal Thoughts:  Yes.  without intent/plan  Homicidal Thoughts:  No  Memory:  Immediate;   Good Recent;   Good Remote;   Good  Judgement:  Fair  Insight:  Fair  Psychomotor Activity:  Normal  Concentration: Concentration: Good and Attention Span: Good  Recall:  Good  Fund of Knowledge:Good  Language: Good  Akathisia:  No  Handed:  Right  AIMS (if indicated):     Assets:  Communication Skills Desire for Improvement Financial Resources/Insurance Housing Physical Health Social Support Transportation  Sleep:       Musculoskeletal: Strength &  Muscle Tone: within normal limits Gait & Station: normal Patient leans: N/A  Blood pressure (!) 127/91, pulse 72, temperature 98.3 F (36.8 C), resp. rate 16, SpO2 100 %.  Recommendations:  Based on my evaluation the patient does not appear to have an emergency medical condition.  Gerlene Burdockravis B Money, FNP 09/13/2017, 10:11 AM
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
## 2017-09-14 LAB — COMPREHENSIVE METABOLIC PANEL
ALT: 44 U/L (ref 0–44)
AST: 29 U/L (ref 15–41)
Albumin: 3.8 g/dL (ref 3.5–5.0)
Alkaline Phosphatase: 50 U/L (ref 38–126)
Anion gap: 10 (ref 5–15)
BUN: 16 mg/dL (ref 6–20)
CHLORIDE: 105 mmol/L (ref 98–111)
CO2: 25 mmol/L (ref 22–32)
CREATININE: 1.2 mg/dL (ref 0.61–1.24)
Calcium: 9.1 mg/dL (ref 8.9–10.3)
GFR calc Af Amer: >60 mL/min (ref >60)
Glucose, Bld: 96 mg/dL (ref 70–99)
Potassium: 4 mmol/L (ref 3.5–5.1)
SODIUM: 140 mmol/L (ref 135–145)
Total Bilirubin: 0.3 mg/dL (ref 0.3–1.2)
Total Protein: 7.2 g/dL (ref 6.5–8.1)

## 2017-09-14 LAB — CBC
HCT: 46.1 % (ref 39.0–52.0)
Hemoglobin: 15.8 g/dL (ref 13.0–17.0)
MCH: 29.6 pg (ref 26.0–34.0)
MCHC: 34.3 g/dL (ref 30.0–36.0)
MCV: 86.5 fL (ref 78.0–100.0)
PLATELETS: 252 10*3/uL (ref 150–400)
RBC: 5.33 MIL/uL (ref 4.22–5.81)
RDW: 12.8 % (ref 11.5–15.5)
WBC: 5.4 10*3/uL (ref 4.0–10.5)

## 2017-09-14 LAB — HEMOGLOBIN A1C
Hgb A1c MFr Bld: 5.9 % — ABNORMAL HIGH (ref 4.8–5.6)
Mean Plasma Glucose: 122.63 mg/dL

## 2017-09-14 LAB — TSH: TSH: 2.51 u[IU]/mL (ref 0.350–4.500)

## 2017-09-14 MED ORDER — SERTRALINE HCL 50 MG PO TABS
50.0000 mg | ORAL_TABLET | Freq: Every day | ORAL | Status: DC
  Administered 2017-09-15 – 2017-09-18 (×4): 50 mg via ORAL
  Filled 2017-09-14 (×6): qty 1

## 2017-09-14 MED ORDER — LISINOPRIL 2.5 MG PO TABS
2.5000 mg | ORAL_TABLET | Freq: Every day | ORAL | Status: DC
  Administered 2017-09-14 – 2017-09-18 (×5): 2.5 mg via ORAL
  Filled 2017-09-14 (×7): qty 1

## 2017-09-14 NOTE — Progress Notes (Signed)
Elevated diastolic BP. Some anxiety. Hx of drinking alcohol 2-3 x/week. Monitor closely.
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
## 2017-09-14 NOTE — Progress Notes (Signed)
Gary Soto is up and interacting some in the milieu.He denies current S.I. He is more talkative and appears less anxious tonight.Trazadone for sleep as requested. 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 BP improved. No physical complaints.

## 2017-09-14 NOTE — Progress Notes (Signed)
Patient ID: Gary Soto, male   DOB: 03/15/1985, 32 y.o.   MRN: [id number]  Nursing Progress Note [id number]  Data: Patient presents calm, pleasant and cooperative. Patient is minimal with Clinical research associatewriter but is seen attending groups on the unit. Patient complaint with scheduled medications. Patient denies pain/physical complaints. Patient provided but declined to complete their self-inventory sheet. Patient currently denies SI/HI/AVH.   Action: Patient educated about and provided medication per provider's orders. Patient safety maintained with q15 min safety checks and frequent rounding. Low fall risk precautions in place. Emotional support given. 1:1 interaction and active listening provided. Patient encouraged to attend meals and groups. Patient encouraged to work on treatment plan and goals. Labs, vital signs and patient behavior monitored throughout shift.   Response: Patient agrees to come to staff if any thoughts of SI/HI develop or if patient develops intention of acting on thoughts. Patient remains safe on the unit at this time. Patient is interacting with peers appropriately on the unit. Will continue to support and monitor.
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
## 2017-09-14 NOTE — [facility] Group Notes (Signed)
[facility] Group Notes:  Nursing Psychoeducational Group  Date:  09/14/2017  Time:  1:00 PM  Group: Life Skills  Facilitator: Patty D. RN  Type of Therapy:  Psychoeducational Skills  Participation Level:  Active  Participation Quality:  Appropriate  Affect:  Appropriate  Cognitive:  Alert and Oriented  Insight:  Improving  Engagement in Group:  Developing/Improving  Modes of Intervention:  Discussion and Education  Rhonna Holster A Mickelle Goupil 09/14/2017, 2:00 PM 

## 2017-09-14 NOTE — [facility] Group Notes (Signed)
[facility]  Date/Time:    09/14/2017 10:00-11:00AM  Type of Therapy and Topic:  Group Therapy:  Healthy vs Unhealthy Coping Skills  Participation Level:  Active   Description of Group:  The focus of this group was to determine what unhealthy coping techniques typically are used by group members and what healthy coping techniques would be helpful in coping with various problems. Patients were guided in becoming aware of the differences between healthy and unhealthy coping techniques.  Patients were asked to identify 1-2 healthy coping skills they would like to learn to use more effectively, and many mentioned meditation, breathing, and relaxation.  These were explained, samples demonstrated, and resources shared for how to learn more at discharge.   At the end of group, additional ideas of healthy coping skills were shared in a fun exercise.  Therapeutic Goals 1. Patients learned that coping is what human beings do all day long to deal with various situations in their lives 2. Patients defined and discussed healthy vs unhealthy coping techniques 3. Patients identified their preferred coping techniques and identified whether these were healthy or unhealthy 4. Patients determined 1-2 healthy coping skills they would like to become more familiar with and use more often, and practiced a few meditations 5. Patients provided support and ideas to each other  Summary of Patient Progress: During group, patient expressed that his healthy coping skills include going to therapy and doing body scans/meditation, while his unhealthy coping skills sometimes relied on are smoking marijuana and drinking.  He stated he would like to go to some support groups to be around people like himself.  He interacted well throughout group.   Therapeutic Modalities Cognitive Behavioral Therapy Motivational Interviewing   Ambrose MantleMareida Grossman-Orr, LCSW 09/14/2017, 12:59 PM

## 2017-09-14 NOTE — Plan of Care (Signed)
  Problem: Safety: Goal: Periods of time without injury will increase Outcome: Progressing  Patient contracts for safety on the unit and is on q15 minute checks. 

## 2017-09-15 NOTE — Plan of Care (Signed)
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 He is smiling. He is pretty guarded and does not disclose much information but currently denies S.I. He had a long conversation on the phone tonight with his wife and that seemed to go well. Gurjot has no physical complaints and he is participating in groups. PRN given for sleep as requested.

## 2017-09-15 NOTE — [facility] Group Notes (Signed)
[facility] LCSW Group Therapy Note  09/15/2017  10:00-11:00AM  Type of Therapy and Topic:  Group Therapy:  Being Your Own Support  Participation Level:  Active   Description of Group:  Patients in this group were introduced to the concept that self-support is an essential part of recovery.  A song entitled "My Own Hero" was played and a group discussion ensued in which patients stated they could relate to the song and it inspired them to realize they have be willing to help themselves in order to succeed, because other people cannot achieve sobriety or stability for them.  We discussed adding a variety of healthy supports to address the various needs in their lives.  A song was played called "I Know Where I've Been" toward the end of group and used to conduct an inspirational wrap-up to group of remembering how far they have already come in their journey.  Therapeutic Goals: 1)  demonstrate the importance of being a part of one's own support system 2)  discuss reasons people in one's life may eventually be unable to be continually supportive  3)  identify the patient's current support system and   4)  elicit commitments to add healthy supports and to become more conscious of being self-supportive   Summary of Patient Progress:  The patient expressed understanding of the topic and was very interactive and reflective throughout group.  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   Therapeutic Modalities:   Motivational Interviewing Activity  Lynnell Chad

## 2017-09-15 NOTE — Progress Notes (Signed)
[facility] MD Progress Note  09/15/2017 11:09 AM Gary Soto  MRN:  [id number] Subjective: Patient is seen and examined.  Patient is a 32 year old male with a past psychiatric history significant for anxiety, depression and sexual addiction.  He is seen in follow-up.  He stated he is slightly better today.  He has been going to groups and talking more.  He has spoken with his wife, but she is not come to visit yet.  They have not really discussed aftercare plans.  He is very interested in therapy and med management afterwards.  He currently denied any suicidal ideation.  He denied any side effects except some mild nausea to the Zoloft. Principal Problem: <principal problem not specified> Diagnosis:   Patient Active Problem List   Diagnosis Date Noted  . MDD (major depressive disorder), severe ([place]) [F32.2] 09/13/2017  . 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 Severe episode of recurrent major depressive disorder, without psychotic features ([place]) [F33.2]   . Substance induced mood disorder ([place]) [F19.94] 11/13/2015   Total Time spent with patient: 15 minutes  Past Psychiatric History: The admission H&P  Past Medical History: History reviewed. No pertinent past medical history. History reviewed. No pertinent surgical history. Family History:  Family History  Problem Relation Age of Onset  . Hypertension Mother   . Hyperlipidemia Mother   . Hypertension Maternal Grandmother   . Hyperlipidemia Maternal Grandmother   . Diabetes Maternal Grandmother   . Heart disease Maternal Grandmother    Family Psychiatric  History: See admission H&P Social History:  Social History   Substance and Sexual Activity  Alcohol Use Yes   Comment: 1/5 liquor 2-3x/week     Social History   Substance and Sexual Activity  Drug Use Yes  . Types: Marijuana    Social History   Socioeconomic History  . Marital status: Single    Spouse name: Not on file  . Number of children: Not on file   . Years of education: Not on file  . Highest education level: Not on file  Occupational History  . Not on file  Social Needs  . Financial resource strain: Not on file  . Food insecurity:    Worry: Not on file    Inability: Not on file  . Transportation needs:    Medical: Not on file    Non-medical: Not on file  Tobacco Use  . Smoking status: Never Smoker  . Smokeless tobacco: Never Used  Substance and Sexual Activity  . Alcohol use: Yes    Comment: 1/5 liquor 2-3x/week  . Drug use: Yes    Types: Marijuana  . Sexual activity: Yes  Lifestyle  . Physical activity:    Days per week: Not on file    Minutes per session: Not on file  . Stress: Not on file  Relationships  . Social connections:    Talks on phone: Not on file    Gets together: Not on file    Attends religious service: Not on file    Active member of club or organization: Not on file    Attends meetings of clubs or organizations: Not on file    Relationship status: Not on file  Other Topics Concern  . Not on file  Social History Narrative  . Not on file   Additional Social History:    Pain Medications: denies Prescriptions: denies Over the Counter: denies History of alcohol / drug use?: Yes Longest period of sobriety (when/how long): patient states that  he has never drank problematically Negative Consequences of Use: Financial, Personal relationships, Work / School Name of Substance 1: alcohol 1 - Age of First Use: 8 1 - Amount (size/oz): 6 beers 1 - Frequency: twice weekly 1 - Duration: for past three months 1 - Last Use / Amount: last pm, 3 beers                  Sleep: Fair  Appetite:  Fair  Current Medications: Current Facility-Administered Medications  Medication Dose Route Frequency Provider Last Rate Last Dose  . acetaminophen (TYLENOL) tablet 650 mg  650 mg Oral Q6H PRN Money, Lowry Ram, FNP      . alum & mag hydroxide-simeth (MAALOX/MYLANTA) 200-200-20 MG/5ML suspension 30 mL  30  mL Oral Q4H PRN Money, Lowry Ram, FNP      . hydrOXYzine (ATARAX/VISTARIL) tablet 25 mg  25 mg Oral TID PRN Money, Lowry Ram, FNP   25 mg at 09/13/17 1946  . lisinopril (PRINIVIL,ZESTRIL) tablet 2.5 mg  2.5 mg Oral Daily Sharma Covert, MD   2.5 mg at 09/15/17 3428  . magnesium hydroxide (MILK OF MAGNESIA) suspension 30 mL  30 mL Oral Daily PRN Money, Lowry Ram, FNP      . sertraline (ZOLOFT) tablet 50 mg  50 mg Oral Daily Sharma Covert, MD   50 mg at 09/15/17 7681  . traZODone (DESYREL) tablet 50 mg  50 mg Oral QHS PRN Money, Lowry Ram, FNP   50 mg at 09/14/17 2253    Lab Results:  Results for orders placed or performed during the hospital encounter of 09/13/17 (from the past 48 hour(s))  Urine rapid drug screen (hosp performed)not at [facility]     Status: None   Collection Time: 09/13/17  5:39 PM  Result Value Ref Range   Opiates NONE DETECTED NONE DETECTED   Cocaine NONE DETECTED NONE DETECTED   Benzodiazepines NONE DETECTED NONE DETECTED   Amphetamines NONE DETECTED NONE DETECTED   Tetrahydrocannabinol NONE DETECTED NONE DETECTED   Barbiturates NONE DETECTED NONE DETECTED    Comment: (NOTE) DRUG SCREEN FOR MEDICAL PURPOSES ONLY.  IF CONFIRMATION IS NEEDED FOR ANY PURPOSE, NOTIFY LAB WITHIN 5 DAYS. LOWEST DETECTABLE LIMITS FOR URINE DRUG SCREEN Drug Class                     Cutoff (ng/mL) Amphetamine and metabolites    1000 Barbiturate and metabolites    200 Benzodiazepine                 157 Tricyclics and metabolites     300 Opiates and metabolites        300 Cocaine and metabolites        300 THC                            50 Performed at [facility], [place] [address], [place],  [address]   CBC     Status: None   Collection Time: 09/14/17  6:19 AM  Result Value Ref Range   WBC 5.4 4.0 - 10.5 K/uL   RBC 5.33 4.22 - 5.81 MIL/uL   Hemoglobin 15.8 13.0 - 17.0 g/dL   HCT 46.1 39.0 - 52.0 %   MCV 86.5 78.0 - 100.0 fL   MCH 29.6 26.0 - 34.0 pg    MCHC 34.3 30.0 - 36.0 g/dL   RDW 12.8 11.5 - 15.5 %  Platelets 252 150 - 400 K/uL    Comment: Performed at [facility], [place] [address] [place], [place] [address]  Comprehensive metabolic panel     Status: None   Collection Time: 09/14/17  6:19 AM  Result Value Ref Range   Sodium 140 135 - 145 mmol/L   Potassium 4.0 3.5 - 5.1 mmol/L   Chloride 105 98 - 111 mmol/L   CO2 25 22 - 32 mmol/L   Glucose, Bld 96 70 - 99 mg/dL   BUN 16 6 - 20 mg/dL   Creatinine, Ser 1.20 0.61 - 1.24 mg/dL   Calcium 9.1 8.9 - 10.3 mg/dL   Total Protein 7.2 6.5 - 8.1 g/dL   Albumin 3.8 3.5 - 5.0 g/dL   AST 29 15 - 41 U/L   ALT 44 0 - 44 U/L   Alkaline Phosphatase 50 38 - 126 U/L   Total Bilirubin 0.3 0.3 - 1.2 mg/dL   GFR calc non Af Amer >60 >60 mL/min   GFR calc Af Amer >60 >60 mL/min    Comment: (NOTE) The eGFR has been calculated using the CKD EPI equation. This calculation has not been validated in all clinical situations. eGFR's persistently <60 mL/min signify possible Chronic Kidney Disease.    Anion gap 10 5 - 15    Comment: Performed at [facility], [place] [address], [place], [place] [address]  Hemoglobin A1c     Status: Abnormal   Collection Time: 09/14/17  6:19 AM  Result Value Ref Range   Hgb A1c MFr Bld 5.9 (H) 4.8 - 5.6 %    Comment: (NOTE) Pre diabetes:          5.7%-6.4% Diabetes:              >6.4% Glycemic control for   <7.0% adults with diabetes    Mean Plasma Glucose 122.63 mg/dL    Comment: Performed at [facility] [address] [place], [place] [address]  TSH     Status: None   Collection Time: 09/14/17  6:19 AM  Result Value Ref Range   TSH 2.510 0.350 - 4.500 uIU/mL    Comment: Performed by a 3rd Generation assay with a functional sensitivity of <=0.01 uIU/mL. Performed at [facility], [place] [address], [place], [place] [address]     Blood Alcohol level:  Lab Results  Component Value Date   ETH <5  29/47/6546    Metabolic Disorder Labs: Lab Results  Component Value Date   HGBA1C 5.9 (H) 09/14/2017   MPG 122.63 09/14/2017   No results found for: PROLACTIN No results found for: CHOL, TRIG, HDL, CHOLHDL, VLDL, LDLCALC  Physical Findings: AIMS: Facial and Oral Movements Muscles of Facial Expression: None, normal Lips and Perioral Area: None, normal Jaw: None, normal Tongue: None, normal,Extremity Movements Upper (arms, wrists, hands, fingers): None, normal Lower (legs, knees, ankles, toes): None, normal, Trunk Movements Neck, shoulders, hips: None, normal, Overall Severity Severity of abnormal movements (highest score from questions above): None, normal Incapacitation due to abnormal movements: None, normal Patient's awareness of abnormal movements (rate only patient's report): No Awareness, Dental Status Current problems with teeth and/or dentures?: No Does patient usually wear dentures?: No  CIWA:    COWS:     Musculoskeletal: Strength & Muscle Tone: within normal limits Gait & Station: normal Patient leans: N/A  Psychiatric Specialty Exam: Physical Exam  Nursing note and vitals reviewed. Constitutional: He is oriented to person, place, and  time. He appears well-developed and well-nourished.  HENT:  Head: Normocephalic and atraumatic.  Respiratory: Effort normal.  Neurological: He is alert and oriented to person, place, and time.    ROS  Blood pressure (!) 112/99, pulse 68, temperature 98 F (36.7 C), temperature source Oral, resp. rate 20, height _0  (1.854 m), weight 108.4 kg, SpO2 100 %.Body mass index is 31.53 kg/m.  General Appearance: Casual  Eye Contact:  Fair  Speech:  Slow  Volume:  Normal  Mood:  Euthymic  Affect:  Congruent  Thought Process:  Coherent and Descriptions of Associations: Intact  Orientation:  Full (Time, Place, and Person)  Thought Content:  Logical  Suicidal Thoughts:  No  Homicidal Thoughts:  No  Memory:  Immediate;    Fair Recent;   Fair Remote;   Fair  Judgement:  Intact  Insight:  Fair  Psychomotor Activity:  Normal  Concentration:  Concentration: Fair and Attention Span: Fair  Recall:  [employer] of Knowledge:  Fair  Language:  Fair  Akathisia:  Negative  Handed:  Right  AIMS (if indicated):     Assets:  Communication Skills Desire for Improvement Financial Resources/Insurance Housing Intimacy Physical Health Resilience Social Support  ADL's:  Intact  Cognition:  WNL  Sleep:  Number of Hours: 6.25     Treatment Plan Summary: Daily contact with patient to assess and evaluate symptoms and progress in treatment, Medication management and Plan : Patient is seen and examined.  Patient is a 32 year old male with the above-stated past psychiatric history seen in follow-up.  He continues on Zoloft 50 mg p.o. daily.  He is tolerating it except for mild nausea.  He denied any suicidal or homicidal ideation.  If everything continues to go well over the next 24 hours we will plan on discharge tomorrow, but 1 of the major factors is confirmation of being able to return home with his wife and children.  Sharma Covert, MD 09/15/2017, 11:09 AM

## 2017-09-15 NOTE — Progress Notes (Signed)
Patient ID: Gary Soto, male   DOB: 09-Jun-1985, 32 y.o.   MRN: [id number]  Nursing Progress Note [id number]  Data: Patient presents calm and cooperative. Patient complaint with scheduled medications. Patient denies pain/physical complaints. Patient completed self-inventory sheet and rates depression, hopelessness, and anxiety 5,1,2 respectively. Patient rates their sleep and appetite as fair/fair respectively. Patient states goal for today is to "gain more tools to help cope". Patient is seen attending groups and visible in the milieu. Patient currently denies SI/HI/AVH.   Action: Patient educated about and provided medication per provider's orders. Patient safety maintained with q15 min safety checks and frequent rounding. Low fall risk precautions in place. Emotional support given. 1:1 interaction and active listening provided. Patient encouraged to attend meals and groups. Patient encouraged to work on treatment plan and goals. Labs, vital signs and patient behavior monitored throughout shift.   Response: Patient agrees to come to staff if any thoughts of SI/HI develop or if patient develops intention of acting on thoughts. Patient remains safe on the unit at this time. Patient is interacting with peers appropriately on the unit. Will continue to support and monitor.

## 2017-09-15 NOTE — Plan of Care (Signed)
Problem: Safety: Goal: Periods of time without injury will increase Outcome: Progressing Patient is on q15 minute safety checks and contracts for safety on the unit.   

## 2017-09-15 NOTE — [facility] Group Notes (Signed)
[facility] Group Notes:  Nursing Psychoeducational Group  Date:  09/15/2017  Time:  1:00 PM  Group: Life Skills  Facilitator: Patty D. RN  Type of Therapy:  Psychoeducational Skills  Participation Level:  Active  Participation Quality:  Appropriate  Affect:  Appropriate  Cognitive:  Alert and Oriented  Insight:  Improving  Engagement in Group:  Developing/Improving  Modes of Intervention:  Discussion and Education  Gary Soto 09/15/2017, 2:00 PM 

## 2017-09-16 NOTE — Tx Team (Signed)
Interdisciplinary Treatment and Diagnostic Plan Update  09/16/2017 Time of Session: 9:20am Gary Soto MRN: [id number]  Principal Diagnosis: <principal problem not specified>  Secondary Diagnoses: Active Problems:   MDD (major depressive disorder), severe (HCC)   Obsessive compulsive personality disorder (HCC)   Severe episode of recurrent major depressive disorder, without psychotic features (HCC)   Current Medications:  Current Facility-Administered Medications  Medication Dose Route Frequency Provider Last Rate Last Dose  . acetaminophen (TYLENOL) tablet 650 mg  650 mg Oral Q6H PRN Money, Gerlene Burdock, FNP      . alum & mag hydroxide-simeth (MAALOX/MYLANTA) 200-200-20 MG/5ML suspension 30 mL  30 mL Oral Q4H PRN Money, Gerlene Burdock, FNP      . hydrOXYzine (ATARAX/VISTARIL) tablet 25 mg  25 mg Oral TID PRN Money, Gerlene Burdock, FNP   25 mg at 09/13/17 1946  . lisinopril (PRINIVIL,ZESTRIL) tablet 2.5 mg  2.5 mg Oral Daily Antonieta Pert, MD   2.5 mg at 09/16/17 3151  . magnesium hydroxide (MILK OF MAGNESIA) suspension 30 mL  30 mL Oral Daily PRN Money, Gerlene Burdock, FNP      . sertraline (ZOLOFT) tablet 50 mg  50 mg Oral Daily Antonieta Pert, MD   50 mg at 09/16/17 7616  . traZODone (DESYREL) tablet 50 mg  50 mg Oral QHS PRN Money, Gerlene Burdock, FNP   50 mg at 09/15/17 2227   PTA Medications: No medications prior to admission.    Patient Stressors: Financial difficulties Marital or family conflict Substance abuse  Patient Strengths: Ability for insight Capable of independent living Barrister's clerk for treatment/growth Physical Health Special hobby/interest  Treatment Modalities: Medication Management, Group therapy, Case management,  1 to 1 session with clinician, Psychoeducation, Recreational therapy.   Physician Treatment Plan for Primary Diagnosis: <principal problem not specified> Long Term Goal(s): Improvement in symptoms so as ready for discharge Improvement in  symptoms so as ready for discharge   Short Term Goals: Ability to identify changes in lifestyle to reduce recurrence of condition will improve Ability to verbalize feelings will improve Ability to disclose and discuss suicidal ideas Ability to demonstrate self-control will improve Ability to identify and develop effective coping behaviors will improve Ability to maintain clinical measurements within normal limits will improve Ability to identify changes in lifestyle to reduce recurrence of condition will improve Ability to verbalize feelings will improve Ability to disclose and discuss suicidal ideas Ability to demonstrate self-control will improve Ability to identify and develop effective coping behaviors will improve Ability to maintain clinical measurements within normal limits will improve  Medication Management: Evaluate patient's response, side effects, and tolerance of medication regimen.  Therapeutic Interventions: 1 to 1 sessions, Unit Group sessions and Medication administration.  Evaluation of Outcomes: Progressing  Physician Treatment Plan for Secondary Diagnosis: Active Problems:   MDD (major depressive disorder), severe (HCC)   Obsessive compulsive personality disorder (HCC)   Severe episode of recurrent major depressive disorder, without psychotic features (HCC)  Long Term Goal(s): Improvement in symptoms so as ready for discharge Improvement in symptoms so as ready for discharge   Short Term Goals: Ability to identify changes in lifestyle to reduce recurrence of condition will improve Ability to verbalize feelings will improve Ability to disclose and discuss suicidal ideas Ability to demonstrate self-control will improve Ability to identify and develop effective coping behaviors will improve Ability to maintain clinical measurements within normal limits will improve Ability to identify changes in lifestyle to reduce recurrence of condition will improve Ability to  verbalize feelings will improve Ability to disclose and discuss suicidal ideas Ability to demonstrate self-control will improve Ability to identify and develop effective coping behaviors will improve Ability to maintain clinical measurements within normal limits will improve     Medication Management: Evaluate patient's response, side effects, and tolerance of medication regimen.  Therapeutic Interventions: 1 to 1 sessions, Unit Group sessions and Medication administration.  Evaluation of Outcomes: Progressing   RN Treatment Plan for Primary Diagnosis: <principal problem not specified> Long Term Goal(s): Knowledge of disease and therapeutic regimen to maintain health will improve  Short Term Goals: Ability to demonstrate self-control, Ability to participate in decision making will improve, Ability to identify and develop effective coping behaviors will improve and Compliance with prescribed medications will improve  Medication Management: RN will administer medications as ordered by provider, will assess and evaluate patient's response and provide education to patient for prescribed medication. RN will report any adverse and/or side effects to prescribing provider.  Therapeutic Interventions: 1 on 1 counseling sessions, Psychoeducation, Medication administration, Evaluate responses to treatment, Monitor vital signs and CBGs as ordered, Perform/monitor CIWA, COWS, AIMS and Fall Risk screenings as ordered, Perform wound care treatments as ordered.  Evaluation of Outcomes: Progressing   LCSW Treatment Plan for Primary Diagnosis: <principal problem not specified> Long Term Goal(s): Safe transition to appropriate next level of care at discharge, Engage patient in therapeutic group addressing interpersonal concerns.  Short Term Goals: Engage patient in aftercare planning with referrals and resources and Increase skills for wellness and recovery  Therapeutic Interventions: Assess for all  discharge needs, 1 to 1 time with Social worker, Explore available resources and support systems, Assess for adequacy in community support network, Educate family and significant other(s) on suicide prevention, Complete Psychosocial Assessment, Interpersonal group therapy.  Evaluation of Outcomes: Progressing   Progress in Treatment: Attending groups: Yes. Participating in groups: Yes. Taking medication as prescribed: Yes. Toleration medication: Yes. Family/Significant other contact made: No, will contact:  the patient's wife Patient understands diagnosis: Yes. Discussing patient identified problems/goals with staff: Yes. Medical problems stabilized or resolved: Yes. Denies suicidal/homicidal ideation: Yes. Issues/concerns per patient self-inventory: No. Other:   New problem(s) identified: None  New Short Term/Long Term Goal(s):medication stabilization, elimination of SI thoughts, development of comprehensive mental wellness plan.    Patient Goals:  "Learn how to change my job and find something that works for me"  Discharge Plan or Barriers: CSW will assess for an appropriate discharge plan.   Reason for Continuation of Hospitalization: Anxiety Depression Medication stabilization  Estimated Length of Stay:3-5 days   Attendees: Patient: Gary Soto 09/16/2017 8:29 AM  Physician: Dr. Landry Mellow, MD 09/16/2017 8:29 AM  Nursing: Meriam Sprague.Kirtland Bouchard, RN 09/16/2017 8:29 AM  RN Care Manager: Juliann Pares 09/16/2017 8:29 AM  Social Worker: Baldo Daub, LCSWA 09/16/2017 8:29 AM  Recreational Therapist: Juliann Pares 09/16/2017 8:29 AM  Other: X 09/16/2017 8:29 AM  Other: X 09/16/2017 8:29 AM  Other:X 09/16/2017 8:29 AM    Scribe for Treatment Team: Maeola Sarah, LCSWA 09/16/2017 8:29 AM

## 2017-09-16 NOTE — Progress Notes (Signed)
Adult Psychoeducational Group Note  Date:  09/16/2017 Time:  10:20 PM  Group Topic/Focus:  Wrap-Up Group:   The focus of this group is to help patients review their daily goal of treatment and discuss progress on daily workbooks.  Participation Level:  Active  Participation Quality:  Appropriate  Affect:  Appropriate  Cognitive:  Appropriate  Insight: Appropriate  Engagement in Group:  Engaged  Modes of Intervention:  Discussion  Additional Comments:  Pt goal was to meet with the social worker about outpatient supports that are available once he is discharged; however, the social worker left early today.  Pt rated the day at a 8/10.  Eliya Bubar 09/16/2017, 10:20 PM

## 2017-09-16 NOTE — Plan of Care (Signed)
Nurse discussed anxiety, depression, coping skills with patient. 

## 2017-09-16 NOTE — Progress Notes (Signed)
[facility] MD Progress Note  09/16/2017 1:49 PM Gary Soto  MRN:  [id number] Subjective: Patient is seen and examined.  Patient's 32 year old male with a past psychiatric history significant for sexual addiction, anxiety and depression.  He is seen in follow-up.  He is essentially unchanged.  He has been going to group and talking more.  He has spoken with his wife.  They are concerned about that he takes enough time to be able to get better.  We discussed about transparency with his wife and trying to eliminate all electronic devices if at all possible.  He denied any suicidal ideation.  He denied any side effects to the medications.  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 Principal Problem: <principal problem not specified> Diagnosis:   Patient Active Problem List   Diagnosis Date Noted  . 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 Severe episode of recurrent major depressive disorder, without psychotic features (HCC) [F33.2]   . Substance induced mood disorder (HCC) [F19.94] 11/13/2015   Total Time spent with patient: 15 minutes  Past Psychiatric History: See admission H&P  Past Medical History: History reviewed. No pertinent past medical history. History reviewed. No pertinent surgical history. Family History:  Family History  Problem Relation Age of Onset  . Hypertension Mother   . Hyperlipidemia Mother   . Hypertension Maternal Grandmother   . Hyperlipidemia Maternal Grandmother   . Diabetes Maternal Grandmother   . Heart disease Maternal Grandmother    Family Psychiatric  History: See admission H&P Social History:  Social History   Substance and Sexual Activity  Alcohol Use Yes   Comment: 1/5 liquor 2-3x/week     Social History   Substance and Sexual Activity  Drug Use Yes  . Types: Marijuana    Social History   Socioeconomic  History  . Marital status: Single    Spouse name: Not on file  . Number of children: Not on file  . Years of education: Not on file  . Highest education level: Not on file  Occupational History  . Not on file  Social Needs  . Financial resource strain: Not on file  . Food insecurity:    Worry: Not on file    Inability: Not on file  . Transportation needs:    Medical: Not on file    Non-medical: Not on file  Tobacco Use  . Smoking status: Never Smoker  . Smokeless tobacco: Never Used  Substance and Sexual Activity  . Alcohol use: Yes    Comment: 1/5 liquor 2-3x/week  . Drug use: Yes    Types: Marijuana  . Sexual activity: Yes  Lifestyle  . Physical activity:    Days per week: Not on file    Minutes per session: Not on file  . Stress: Not on file  Relationships  . Social connections:    Talks on phone: Not on file    Gets together: Not on file    Attends religious service: Not on file    Active member of club or organization: Not on file    Attends meetings of clubs or organizations: Not on file    Relationship status: Not on file  Other Topics Concern  . Not on file  Social History Narrative  . Not on file   Additional Social History:    Pain Medications: denies Prescriptions: denies Over the Counter: denies  History of alcohol / drug use?: Yes Longest period of sobriety (when/how long): patient states that he has never drank problematically Negative Consequences of Use: Financial, Personal relationships, Work / School Name of Substance 1: alcohol 1 - Age of First Use: 8 1 - Amount (size/oz): 6 beers 1 - Frequency: twice weekly 1 - Duration: for past three months 1 - Last Use / Amount: last pm, 3 beers                  Sleep: Fair  Appetite:  Fair  Current Medications: Current Facility-Administered Medications  Medication Dose Route Frequency Provider Last Rate Last Dose  . acetaminophen (TYLENOL) tablet 650 mg  650 mg Oral Q6H PRN Money, Gerlene Burdock, FNP      . alum & mag hydroxide-simeth (MAALOX/MYLANTA) 200-200-20 MG/5ML suspension 30 mL  30 mL Oral Q4H PRN Money, Gerlene Burdock, FNP      . hydrOXYzine (ATARAX/VISTARIL) tablet 25 mg  25 mg Oral TID PRN Money, Gerlene Burdock, FNP   25 mg at 09/13/17 1946  . lisinopril (PRINIVIL,ZESTRIL) tablet 2.5 mg  2.5 mg Oral Daily Antonieta Pert, MD   2.5 mg at 09/16/17 8832  . magnesium hydroxide (MILK OF MAGNESIA) suspension 30 mL  30 mL Oral Daily PRN Money, Gerlene Burdock, FNP      . sertraline (ZOLOFT) tablet 50 mg  50 mg Oral Daily Antonieta Pert, MD   50 mg at 09/16/17 5498  . traZODone (DESYREL) tablet 50 mg  50 mg Oral QHS PRN Money, Gerlene Burdock, FNP   50 mg at 09/15/17 2227    Lab Results: No results found for this or any previous visit (from the past 48 hour(s)).  Blood Alcohol level:  Lab Results  Component Value Date   ETH <5 11/13/2015    Metabolic Disorder Labs: Lab Results  Component Value Date   HGBA1C 5.9 (H) 09/14/2017   MPG 122.63 09/14/2017   No results found for: PROLACTIN No results found for: CHOL, TRIG, HDL, CHOLHDL, VLDL, LDLCALC  Physical Findings: AIMS: Facial and Oral Movements Muscles of Facial Expression: None, normal Lips and Perioral Area: None, normal Jaw: None, normal Tongue: None, normal,Extremity Movements Upper (arms, wrists, hands, fingers): None, normal Lower (legs, knees, ankles, toes): None, normal, Trunk Movements Neck, shoulders, hips: None, normal, Overall Severity Severity of abnormal movements (highest score from questions above): None, normal Incapacitation due to abnormal movements: None, normal Patient's awareness of abnormal movements (rate only patient's report): No Awareness, Dental Status Current problems with teeth and/or dentures?: No Does patient usually wear dentures?: No  CIWA:    COWS:     Musculoskeletal: Strength & Muscle Tone: within normal limits Gait & Station: normal Patient leans: N/A  Psychiatric Specialty  Exam: Physical Exam  Nursing note and vitals reviewed. Constitutional: He is oriented to person, place, and time. He appears well-developed and well-nourished.  HENT:  Head: Normocephalic and atraumatic.  Respiratory: Effort normal.  Neurological: He is alert and oriented to person, place, and time.    ROS  Blood pressure (!) 112/99, pulse 68, temperature 98 F (36.7 C), temperature source Oral, resp. rate 20, height 6\' 1"  (1.854 m), weight 108.4 kg, SpO2 100 %.Body mass index is 31.53 kg/m.  General Appearance: Casual  Eye Contact:  Fair  Speech:  Normal Rate  Volume:  Normal  Mood:  Anxious  Affect:  Congruent  Thought Process:  Coherent and Descriptions of Associations: Intact  Orientation:  Full (Time, Place,  and Person)  Thought Content:  Logical  Suicidal Thoughts:  No  Homicidal Thoughts:  No  Memory:  Immediate;   Fair Recent;   Fair Remote;   Fair  Judgement:  Intact  Insight:  Fair  Psychomotor Activity:  Normal  Concentration:  Concentration: Fair and Attention Span: Fair  Recall:  [employer] of Knowledge:  Fair  Language:  Fair  Akathisia:  Negative  Handed:  Right  AIMS (if indicated):     Assets:  Communication Skills Desire for Improvement Financial Resources/Insurance Housing Physical Health Resilience Social Support Talents/Skills  ADL's:  Intact  Cognition:  WNL  Sleep:  Number of Hours: 6.5     Treatment Plan Summary: Daily contact with patient to assess and evaluate symptoms and progress in treatment, Medication management and Plan : Patient is seen and examined.  Patient is a 32 year old male with a past psychiatric history significant for sexual addiction, depression and anxiety.  He is seen in follow-up.  #1 continue Zoloft 50 mg p.o. daily for obsessive thinking as well as anxiety and depression.  #2 no change in trazodone.  #3 continue lisinopril for hypertension.  #4 discussion with social work on disposition planning and  follow-up.  Antonieta Pert, MD 09/16/2017, 1:49 PM

## 2017-09-16 NOTE — Progress Notes (Signed)
D:  Patient's self inventory sheet, patient sleeps good, sleep medication helpful.  Good appetite, normal energy level, good concentration.  Denied depression, hopeless and anxiety.  Denied withdrawals.  Denied SI.  Denied physical problems.  Denied physical pain.  Goal is develop plan for discharge.  Plans to talk to MD, SW.  Wants to be assessed for disorders, needs help with [place] job aid.  No discharge plans. A:  Medications administered per MD orders.  Emotional support and encouragement given patient. R:  Denied SI and HI, contracts for safety.  Denied A/V hallucinations.  Safety maintained with 15 minute checks.

## 2017-09-16 NOTE — Progress Notes (Signed)
Adult Psychoeducational Group Note  Date:  09/16/2017 Time:  4:48 PM  Group Topic/Focus:  Developing a Wellness Toolbox:   The focus of this group is to help patients develop a "wellness toolbox" with skills and strategies to promote recovery upon discharge.  Participation Level:  Active  Participation Quality:  Appropriate  Affect:  Appropriate  Cognitive:  Appropriate  Insight: Appropriate  Engagement in Group:  Engaged  Modes of Intervention:  Discussion  Additional Comments:  Pt attended group ans shared the parts of wellness he would like to work on.  Gary Soto Gary Soto 09/16/2017, 4:48 PM

## 2017-09-16 NOTE — Progress Notes (Signed)
Recreation Therapy Notes  Date: 09/16/17 Time: 0930 Location: [address] Dayroom  Group Topic: Stress Management  Goal Area(s) Addresses:  Patient will verbalize importance of using healthy stress management.  Patient will identify positive emotions associated with healthy stress management.   Intervention: Stress Management  Activity :  Meditation.  LRT introduced the stress management technique of meditation.  Patients were to listen and follow along as the meditation played in order to relax.  Education:  Stress Management, Discharge Planning.   Education Outcome: Acknowledges edcuation/In group clarification offered/Needs additional education  Clinical Observations/Feedback: Pt did not attend group.     Breahna Boylen, LRT/CTRS         Lorenzo Arscott A 09/16/2017 12:18 PM 

## 2017-09-17 DIAGNOSIS — F322 Major depressive disorder, single episode, severe without psychotic features: Secondary | ICD-10-CM

## 2017-09-17 LAB — BASIC METABOLIC PANEL
Anion gap: 8 (ref 5–15)
BUN: 12 mg/dL (ref 6–20)
CALCIUM: 9.1 mg/dL (ref 8.9–10.3)
CHLORIDE: 105 mmol/L (ref 98–111)
CO2: 27 mmol/L (ref 22–32)
CREATININE: 1.12 mg/dL (ref 0.61–1.24)
GFR calc Af Amer: >60 mL/min (ref >60)
Glucose, Bld: 102 mg/dL — ABNORMAL HIGH (ref 70–99)
POTASSIUM: 4 mmol/L (ref 3.5–5.1)
Sodium: 140 mmol/L (ref 135–145)

## 2017-09-17 NOTE — Plan of Care (Signed)
D: Pt denies SI/HI/AVH. Pt is pleasant and cooperative. Pt visible on the unit this evening. Pt anxious about his situation with his wife and concerned about his future. 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 Pt informed he knows what he needs to do and he knows what he wants to do, so he needs to put things in place to make things happen , because change will only happen when he decides to do it.  A: Pt was offered support and encouragement. Pt was given scheduled medications. Pt was encourage to attend groups. Q 15 minute checks were done for safety.  R:Pt attends groups and interacts well with peers and staff. Pt is taking medication. Pt has no complaints.Pt receptive to treatment and safety maintained on unit.   Problem: Education: Goal: Emotional status will improve Outcome: Progressing   Problem: Education: Goal: Mental status will improve Outcome: Progressing   Problem: Education: Goal: Verbalization of understanding the information provided will improve Outcome: Progressing   Problem: Activity: Goal: Sleeping patterns will improve Outcome: Progressing   Problem: Health Behavior/Discharge Planning: Goal: Compliance with treatment plan for underlying cause of condition will improve Outcome: Progressing

## 2017-09-17 NOTE — [facility] Group Notes (Signed)
Pt attended spiritual care group on grief and loss facilitated by chaplain Idamae Coccia   Group opened with brief discussion and psycho-social ed around grief and loss in relationships and in relation to self.  Group identifyed life patterns, circumstances, changes that are connected to grief response. Established group norm of speaking from own life experience. Group goal of establishing open and affirming space for members to share loss and experience with grief, normalize grief experience and provide psycho social education and grief support. Group engaged in facilitated discussion or grief process.  Engaged with Worden's 4 tasks of grief.    

## 2017-09-17 NOTE — [facility] Group Notes (Signed)
LCSW Group Therapy Note 09/17/2017 11:02 AM  Type of Therapy and Topic: Group Therapy: Overcoming Obstacles  Participation Level: Active  Description of Group:  In this group patients will be encouraged to explore what they see as obstacles to their own wellness and recovery. They will be guided to discuss their thoughts, feelings, and behaviors related to these obstacles. The group will process together ways to cope with barriers, with attention given to specific choices patients can make. Each patient will be challenged to identify changes they are motivated to make in order to overcome their obstacles. This group will be process-oriented, with patients participating in exploration of their own experiences as well as giving and receiving support and challenge from other group members.  Therapeutic Goals: 1. Patient will identify personal and current obstacles as they relate to admission. 2. Patient will identify barriers that currently interfere with their wellness or overcoming obstacles.  3. Patient will identify feelings, thought process and behaviors related to these barriers. 4. Patient will identify two changes they are willing to make to overcome these obstacles:   Summary of Patient Progress  Gary Soto was engaged and participated throughout the group session. Gary Soto reports that "having to provide for myself and family (working too much)" is his main obstacle. Gary Soto states he hopes to find new employment so that he can do what makes him happy and spend more time with his loved ones.    Therapeutic Modalities:  Cognitive Behavioral Therapy Solution Focused Therapy Motivational Interviewing Relapse Prevention Therapy   Alcario Drought Clinical Social Worker

## 2017-09-17 NOTE — [facility] Suicide Risk Assessment (Signed)
[facility] INPATIENT:  Family/Significant Other Suicide Prevention Education  Suicide Prevention Education:  Education Completed; Niceeta Minor [phone]) has been identified by the patient as the family member/significant other with whom the patient will be residing, and identified as the person(s) who will aid the patient in the event of a mental health crisis (suicidal ideations/suicide attempt).  With written consent from the patient, the family member/significant other has been provided the following suicide prevention education, prior to the and/or following the discharge of the patient.  The suicide prevention education provided includes the following:  Suicide risk factors  Suicide prevention and interventions  National Suicide Hotline telephone number  [facility] assessment telephone number  [facility] Emergency Assistance 911  [facility] and/or Residential Mobile Crisis Unit telephone number  Request made of family/significant other to:  Remove weapons (e.g., guns, rifles, knives), all items previously/currently identified as safety concern.    Remove drugs/medications (over-the-counter, prescriptions, illicit drugs), all items previously/currently identified as a safety concern.  The family member/significant other verbalizes understanding of the suicide prevention education information provided.  The family member/significant other agrees to remove the items of safety concern listed above.  Patient's wife has no concerns regarding the patient discharging soon.   Maeola Sarah 09/17/2017, 2:33 PM

## 2017-09-17 NOTE — Progress Notes (Signed)
D: Pt denies SI/HI/AVH. Pt is pleasant and cooperative. Pt kept to himself some of the evening. But was visible on the unit A: Pt was offered support and encouragement. Pt was given scheduled medications. Pt was encourage to attend groups. Q 15 minute checks were done for safety.  R:Pt attends groups and interacts well with peers and staff. Pt is taking medication. Pt has no complaints.Pt receptive to treatment and safety maintained on unit.  Problem: Education: Goal: Emotional status will improve Outcome: Progressing   Problem: Activity: Goal: Interest or engagement in activities will improve Outcome: Progressing   Problem: Activity: Goal: Sleeping patterns will improve Outcome: Progressing   Problem: Safety: Goal: Periods of time without injury will increase Outcome: Progressing

## 2017-09-17 NOTE — Progress Notes (Signed)
D: Patient is visible in the milieu.  He is pleasant with staff and interacts well with others.  Patient denies any depressive symptoms or thoughts of self harm.  His goal today is "to get a plan together to change some things when I get out."  Patient is focused on discharge today.  He is sleeping and eating well; his energy level is normal and his concentration is good.    A: Continue to monitor medication management and MD orders.  Safety checks completed every 15 minutes per protocol.  Offer support and encouragement as needed.  R: Patient is receptive to staff; his behavior is appropriate.

## 2017-09-17 NOTE — Progress Notes (Signed)
[facility] MD Progress Note  09/17/2017 1:15 PM Gary Soto  MRN:  [id number] Subjective: patient reports he is feeling better, less depressed, less anxious . Denies suicidal ideations. Denies medication side effects. Objective : I have discussed case with treatment team and have met with patient. 32 year old male, married, 4 children, employed. Presented voluntarily due to worsening depression, anxiety, suicidal ideations with thoughts of overdosing. 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 Describes frequently going on dating sites [place] women, particularly when anxious or sad, states " I guess I use it as a way to cope". He also reports work related stress is a factor that has contributed . States " I would like to change jobs if I could, to something less stressful". Currently on Zoloft- new trial- no side effects so far. Denies suicidal or violent /homicidal ideations. As per notes, has been pleasant/visible in milieu. Labs reviewed .      Principal Problem: Depression/ Anxiety  Diagnosis:   Patient Active Problem List   Diagnosis Date Noted  . MDD (major depressive disorder), severe ([place]) [F32.2] 09/13/2017  . 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 Severe episode of recurrent major depressive disorder, without psychotic features ([place]) [F33.2]   . Substance induced mood disorder ([place]) [F19.94] 11/13/2015   Total Time spent with patient: 20 minutes   Past Psychiatric History: See admission H&P  Past Medical History: History reviewed. No pertinent past medical history. History reviewed. No pertinent surgical history. Family History:  Family History  Problem Relation Age of Onset  . Hypertension Mother   . Hyperlipidemia Mother   . Hypertension Maternal Grandmother   . Hyperlipidemia Maternal Grandmother   . Diabetes Maternal Grandmother   . Heart disease Maternal  Grandmother    Family Psychiatric  History: See admission H&P Social History:  Social History   Substance and Sexual Activity  Alcohol Use Yes   Comment: 1/5 liquor 2-3x/week     Social History   Substance and Sexual Activity  Drug Use Yes  . Types: Marijuana    Social History   Socioeconomic History  . Marital status: Single    Spouse name: Not on file  . Number of children: Not on file  . Years of education: Not on file  . Highest education level: Not on file  Occupational History  . Not on file  Social Needs  . Financial resource strain: Not on file  . Food insecurity:    Worry: Not on file    Inability: Not on file  . Transportation needs:    Medical: Not on file    Non-medical: Not on file  Tobacco Use  . Smoking status: Never Smoker  . Smokeless tobacco: Never Used  Substance and Sexual Activity  . Alcohol use: Yes    Comment: 1/5 liquor 2-3x/week  . Drug use: Yes    Types: Marijuana  . Sexual activity: Yes  Lifestyle  . Physical activity:    Days per week: Not on file    Minutes per session: Not on file  . Stress: Not on file  Relationships  . Social connections:    Talks on phone: Not on file    Gets together: Not on file    Attends religious service: Not on file    Active member of club or organization: Not on file    Attends meetings of clubs or organizations: Not on file  Relationship status: Not on file  Other Topics Concern  . Not on file  Social History Narrative  . Not on file   Additional Social History:    Pain Medications: denies Prescriptions: denies Over the Counter: denies History of alcohol / drug use?: Yes Longest period of sobriety (when/how long): patient states that he has never drank problematically Negative Consequences of Use: Financial, Personal relationships, Work / School Name of Substance 1: alcohol 1 - Age of First Use: 8 1 - Amount (size/oz): 6 beers 1 - Frequency: twice weekly 1 - Duration: for past three  months 1 - Last Use / Amount: last pm, 3 beers  Sleep: Good  Appetite:  Good  Current Medications: Current Facility-Administered Medications  Medication Dose Route Frequency Provider Last Rate Last Dose  . acetaminophen (TYLENOL) tablet 650 mg  650 mg Oral Q6H PRN Money, Lowry Ram, FNP      . alum & mag hydroxide-simeth (MAALOX/MYLANTA) 200-200-20 MG/5ML suspension 30 mL  30 mL Oral Q4H PRN Money, Lowry Ram, FNP      . hydrOXYzine (ATARAX/VISTARIL) tablet 25 mg  25 mg Oral TID PRN Money, Lowry Ram, FNP   25 mg at 09/13/17 1946  . lisinopril (PRINIVIL,ZESTRIL) tablet 2.5 mg  2.5 mg Oral Daily Sharma Covert, MD   2.5 mg at 09/17/17 0815  . magnesium hydroxide (MILK OF MAGNESIA) suspension 30 mL  30 mL Oral Daily PRN Money, Lowry Ram, FNP      . sertraline (ZOLOFT) tablet 50 mg  50 mg Oral Daily Sharma Covert, MD   50 mg at 09/17/17 0815  . traZODone (DESYREL) tablet 50 mg  50 mg Oral QHS PRN Money, Lowry Ram, FNP   50 mg at 09/16/17 2250    Lab Results:  Results for orders placed or performed during the hospital encounter of 09/13/17 (from the past 48 hour(s))  Basic metabolic panel     Status: Abnormal   Collection Time: 09/17/17  6:03 AM  Result Value Ref Range   Sodium 140 135 - 145 mmol/L   Potassium 4.0 3.5 - 5.1 mmol/L   Chloride 105 98 - 111 mmol/L   CO2 27 22 - 32 mmol/L   Glucose, Bld 102 (H) 70 - 99 mg/dL   BUN 12 6 - 20 mg/dL   Creatinine, Ser 1.12 0.61 - 1.24 mg/dL   Calcium 9.1 8.9 - 10.3 mg/dL   GFR calc non Af Amer >60 >60 mL/min   GFR calc Af Amer >60 >60 mL/min    Comment: (NOTE) The eGFR has been calculated using the CKD EPI equation. This calculation has not been validated in all clinical situations. eGFR's persistently <60 mL/min signify possible Chronic Kidney Disease.    Anion gap 8 5 - 15    Comment: Performed at [facility], [place] [address], [place], [place] [address]    Blood Alcohol level:  Lab Results  Component Value Date    ETH <5 63/84/5364    Metabolic Disorder Labs: Lab Results  Component Value Date   HGBA1C 5.9 (H) 09/14/2017   MPG 122.63 09/14/2017   No results found for: PROLACTIN No results found for: CHOL, TRIG, HDL, CHOLHDL, VLDL, LDLCALC  Physical Findings: AIMS: Facial and Oral Movements Muscles of Facial Expression: None, normal Lips and Perioral Area: None, normal Jaw: None, normal Tongue: None, normal,Extremity Movements Upper (arms, wrists, hands, fingers): None, normal Lower (legs, knees, ankles, toes): None, normal, Trunk Movements Neck, shoulders, hips: None, normal, Overall  Severity Severity of abnormal movements (highest score from questions above): None, normal Incapacitation due to abnormal movements: None, normal Patient's awareness of abnormal movements (rate only patient's report): No Awareness, Dental Status Current problems with teeth and/or dentures?: No Does patient usually wear dentures?: No  CIWA:  CIWA-Ar Total: 1 COWS:  COWS Total Score: 1  Musculoskeletal: Strength & Muscle Tone: within normal limits Gait & Station: normal Patient leans: N/A  Psychiatric Specialty Exam: Physical Exam  Nursing note and vitals reviewed. Constitutional: He is oriented to person, place, and time. He appears well-developed and well-nourished.  HENT:  [place]: Normocephalic and atraumatic.  Respiratory: Effort normal.  Neurological: He is alert and oriented to person, place, and time.    ROS no headache, no chest pain, no shortness of breath, no vomiting, no diarrhea   Blood pressure 130/82, pulse 63, temperature 98.4 F (36.9 C), temperature source Oral, resp. rate 20, height _0  (1.854 m), weight 108.4 kg, SpO2 100 %.Body mass index is 31.53 kg/m.  General Appearance: improving grooming   Eye Contact:  Good  Speech:  Normal Rate  Volume:  Normal  Mood:  partially improved mood, states he is feeling better than on admission  Affect:  vaguely constricted, but reactive,  smiles at times appropriately  Thought Process:  Linear and Descriptions of Associations: Intact  Orientation:  Other:  fully alert and attentive  Thought Content:  no hallucinations, no delusions   Suicidal Thoughts:  No denies suicidal ideations, no self injurious ideations, denies homicidal ideations, and also specifically denies any HI or violent ideations towards wife   Homicidal Thoughts:  No  Memory:  recent and remote grossly intact   Judgement:  Fair- improving   Insight:  Fair- improving   Psychomotor Activity:  Normal  Concentration:  Concentration: Good and Attention Span: Good  Recall:  Good  Fund of Knowledge:  Good  Language:  Good  Akathisia:  Negative  Handed:  Right  AIMS (if indicated):     Assets:  Communication Skills Desire for Improvement Financial Resources/Insurance Housing Physical Health Resilience Social Support Talents/Skills  ADL's:  Intact  Cognition:  WNL  Sleep:  Number of Hours: 6.25   Assessment- 32 year old married male, presented for worsening depression, anxiety, suicidal ideations in the context of marital discord . Today reports he is feeling noticeably better than on admission, and presents with improving mood and a more reactive affect . Denies suicidal ideations. Presents future oriented at this time. Currently on Zoloft, which he is tolerating well thus far .    Treatment Plan Summary:  Treatment Plan reviewed as below today 9/3.  Encourage group and milieu participation to work on Radiographer, therapeutic  Continue Zoloft 50 mgrs QDAY for depression, anxiety Continue Trazodone 50 mgrs QHS PRN for insomnia Continue Vistaril 25 mgrs Q 8 hours PRN for anxiety  Continue Lisinopril 2.5 mgrs QDAY for HTN Treatment team working on disposition planning .   Jenne Campus, MD 09/17/2017, 1:15 PM   Patient ID: Gary Soto, male   DOB: 10/28/1985, 32 y.o.   MRN: [id number]

## 2017-09-18 ENCOUNTER — Encounter (HOSPITAL_COMMUNITY): Payer: Self-pay | Admitting: Behavioral Health

## 2017-09-18 MED ORDER — SERTRALINE HCL 50 MG PO TABS: 50.0000 mg | ORAL_TABLET | Freq: Every day | ORAL | 0 refills | Status: DC

## 2017-09-18 MED ORDER — LISINOPRIL 2.5 MG PO TABS: 2.5000 mg | ORAL_TABLET | Freq: Every day | ORAL | 0 refills | Status: DC

## 2017-09-18 MED ORDER — HYDROXYZINE HCL 25 MG PO TABS: 25.0000 mg | ORAL_TABLET | Freq: Three times a day (TID) | ORAL | 0 refills | Status: DC | PRN

## 2017-09-18 MED ORDER — TRAZODONE HCL 50 MG PO TABS: 50.0000 mg | ORAL_TABLET | Freq: Every evening | ORAL | 0 refills | Status: DC | PRN

## 2017-09-18 NOTE — Progress Notes (Signed)
  [facility] Adult Case Management Discharge Plan :  Will you be returning to the same living situation after discharge:  Yes,  patient reports he is returning home with his wife and children At discharge, do you have transportation home?: Yes,  patient reports his wife will pick him up at discharge Do you have the ability to pay for your medications: Yes,  [employer], income from employment  Release of information consent forms completed and in the chart;  Patient's signature needed at discharge.  Patient to Follow up at: Follow-up Information    [facility] Go on 09/19/2017.   Why:  Your next appointment with your therapist Corinna Gab is scheduled for Thursday, 09/19/17 at 3:00pm.  Contact information: [address] [place] [address] [phone]        [facility] Go on 09/26/2017.   Why:  Hospital follow up appointment for medication mangement is Thursday, 09/26/17 at 8:30am. Please be sure to bring your Photo ID and any insurance information. If you cannot make this appointment time, please call and reschedule.  Contact information: [address] [place] [address] [phone]           Next level of care provider has access to [facility] Link:yes  Safety Planning and Suicide Prevention discussed: Yes,  with patient's wife     Has patient been referred to the Quitline?: N/A patient is not a smoker  Patient has been referred for addiction treatment: N/A  Maeola Sarah, LCSWA 09/18/2017, 11:03 AM

## 2017-09-18 NOTE — Progress Notes (Signed)
Recreation Therapy Notes  Date: 9.4.19 Time: 0930 Location: [address] Dayroom  Group Topic: Stress Management  Goal Area(s) Addresses:  Patient will verbalize importance of using healthy stress management.  Patient will identify positive emotions associated with healthy stress management.   Intervention: Stress Management  Activity :  Guided Imagery.  LRT introduced the stress management technique of guided imagery.  LRT read a script that allowed patients to enjoy the summer clouds.  Patients were to follow along as LRT read script to engage in activity.  Education:  Stress Management, Discharge Planning.   Education Outcome: Acknowledges edcuation/In group clarification offered/Needs additional education  Clinical Observations/Feedback: Pt did not attend group.     Dabney Dever, LRT/CTRS         Anders Hohmann A 09/18/2017 11:00 AM 

## 2017-09-18 NOTE — [facility] Suicide Risk Assessment (Signed)
[facility] Discharge Suicide Risk Assessment   Principal Problem: Depression  Discharge Diagnoses:  Patient Active Problem List   Diagnosis Date Noted  . 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 Severe episode of recurrent major depressive disorder, without psychotic features ([facility]) [F33.2]   . Substance induced mood disorder ([facility]) [F19.94] 11/13/2015    Total Time spent with patient: 30 minutes  Musculoskeletal: Strength & Muscle Tone: within normal limits Gait & Station: normal Patient leans: N/A  Psychiatric Specialty Exam: ROS mild headache, no chest pain, no shortness of breath, no vomiting , no diarrhea, no rash, no fever   Blood pressure 130/82, pulse 63, temperature 98.4 F (36.9 C), temperature source Oral, resp. rate 20, height 6\' 1"  (1.854 m), weight 108.4 kg, SpO2 100 %.Body mass index is 31.53 kg/m.  General Appearance: Well Groomed  Eye Contact::  Good  Speech:  Normal Rate409  Volume:  Normal  Mood:  reports mood is " a lot better", reports as 9/10 with 10 being best   Affect:  more reactive, vaguely anxious   Thought Process:  Linear and Descriptions of Associations: Intact  Orientation:  Other:  fully alert and attentive  Thought Content:  no hallucinations, no delusions, not internally preoccupied  Suicidal Thoughts:  No denies any suicidal or self injurious ideations, denies homicidal or violent ideations towards anyone   Homicidal Thoughts:  No  Memory:  recent and remote grossly intact   Judgement:  Other:  improving   Insight:  fair- improving  Psychomotor Activity:  Normal  Concentration:  Good  Recall:  Good  Fund of Knowledge:Good  Language: Good  Akathisia:  Negative  Handed:  Right  AIMS (if indicated):     Assets:  Desire for Improvement Resilience Others:  employed   Sleep:  Number of Hours: 6  Cognition: WNL  ADL's:  Intact   Mental Status Per Nursing  Assessment::   On Admission:  Suicidal ideation indicated by patient, Suicide plan, Self-harm thoughts, Intention to act on suicide plan, Self-harm behaviors  Demographic Factors:  32, married, has 4 children, employed   Loss Factors: Reports increased marital tension related to using dating sites , which he describes has been " like an addiction " Stressful job   Historical Factors: No prior psychiatric admissions, history of depression, history of suicide attempt by overdosing 5 years ago.  Risk Reduction Factors:   Responsible for children under 42 years of age, Sense of responsibility to family, Employed, Living with another person, especially a relative and Positive coping skills or problem solving skills  Continued Clinical Symptoms:  At this time patient is alert , attentive, calm, pleasant on approach, describes mood as much improved and currently denies depression, affect more reactive, but remains vaguely anxious, no thought disorder, no suicidal or self injurious ideations, no homicidal ideations, no hallucinations, no delusions, future oriented, focused on returning to work soon. States relationship with wife still tense but improving and that she has told him she wants him to return home and work on marriage . States " I have cancelled all of my accounts and she has free access to my phone and computer " in order for her to build trust in him back.  Denies medication side effects Behavior on unit in good control. Pleasant on approach. With his consent I spoke with his wife via phone - she corroborates patient seems much improved and is in agreement with discharge.  Cognitive Features That Contribute To Risk:  No gross cognitive deficits noted upon discharge. Is alert , attentive, and oriented x 3    Suicide Risk:  Mild:  Suicidal ideation of limited frequency, intensity, duration, and specificity.  There are no identifiable plans, no associated intent, mild dysphoria and  related symptoms, good self-control (both objective and subjective assessment), few other risk factors, and identifiable protective factors, including available and accessible social support.  Follow-up Information    [facility] Go on 09/19/2017.   Why:  Your next appointment with your therapist Corinna Gab is scheduled for Thursday, 09/19/17 at 3:00pm.  Contact information: [address] [place] [address] [phone]        [facility] Go on 09/26/2017.   Why:  Hospital follow up appointment for medication mangement is Thursday, 09/26/17 at 8:30am. Please be sure to bring your Photo ID and any insurance information. If you cannot make this appointment time, please call and reschedule.  Contact information: [address] [place] [address] [phone]           Plan Of Care/Follow-up recommendations:  Activity:  as tolerated  Diet:  heart healthy Tests:  NA Other:  See below  Patient is expressing readiness for discharge- there are no current grounds for ongoing involuntary commitment He is leaving unit in good spirits  Plans to return home Plans to follow up as above .  Patient encouraged to establish regular outpatient care with PCP for ongoing medical management as needed    Craige Cotta, MD 09/18/2017, 7:52 AM

## 2017-09-18 NOTE — [facility] Group Notes (Signed)
[facility] Group Therapy 09/18/2017 1:15pm  Type of Therapy: [facility] [employer] Presentation  Participation Level: Active  Participation Quality: Attentive  Affect: Appropriate  Cognitive: Oriented  Insight: Developing/Improving  Engagement in Therapy: Engaged  Modes of Intervention: Discussion, Education and Socialization  Summary of Progress/Problems: [facility] [employer] ([facility]) Speaker came to talk about his personal journey with mental health. The pt processed ways by which to relate to the speaker. MHA speaker provided handouts and educational information pertaining to groups and services offered by the MHA. Pt was engaged in speaker's presentation and was receptive to resources provided.    Gary Klee S Terriyah Westra, LCSW 09/18/2017 11:47 AM  
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
## 2017-09-18 NOTE — Progress Notes (Signed)
D:  Patient's self inventory sheet, patient sleeps good, sleep medication helpful.  Good appetite, normal energy level, good concentration.  Denied depression, hopeless and anxiety.  Denied withdrawals.  Denied SI.  Denied physical problems.  Physical pain, worst pain #3 in past 24 hours, head.  Goal is follow through with what I have learned and use resources received here.  Plans to research resources and take necessary steps.  Does have discharge plans. A:  Medications administered per MD orders.  Emotional support and encouragement given patient. R:  Denied SI and HI, contracts for safety.  Denied A/V hallucinations.  Safety maintained with 15 minute checks.

## 2017-09-18 NOTE — Progress Notes (Signed)
Discharge Note:  Patient discharged home, has own car in parking lot.  Patient denied SI and HI.  Denied A/V hallucinations.  Suicide prevention information given and discussed with patient who stated he understood and had no questions.  Patient stated he received all his belongings, clothing, toiletries, misc items, prescriptions, etc.  Patient stated he appreciated all assistance received from [facility] staff.  All required discharge information given to patient at discharge.

## 2017-09-23 ENCOUNTER — Emergency Department (HOSPITAL_COMMUNITY)
Admission: EM | Admit: 2017-09-23 | Discharge: 2017-09-23 | Disposition: A | Discharge status: Home / Self Care | Payer: Managed Care, Other (non HMO) | Attending: Emergency Medicine | Admitting: Emergency Medicine

## 2017-09-23 ENCOUNTER — Other Ambulatory Visit: Payer: Self-pay

## 2017-09-23 ENCOUNTER — Emergency Department (HOSPITAL_COMMUNITY): Payer: Managed Care, Other (non HMO)

## 2017-09-23 ENCOUNTER — Encounter (HOSPITAL_COMMUNITY): Payer: Self-pay

## 2017-09-23 DIAGNOSIS — M546 Pain in thoracic spine: Secondary | ICD-10-CM | POA: Diagnosis present

## 2017-09-23 DIAGNOSIS — I10 Essential (primary) hypertension: Secondary | ICD-10-CM | POA: Diagnosis not present

## 2017-09-23 DIAGNOSIS — R0602 Shortness of breath: Secondary | ICD-10-CM

## 2017-09-23 HISTORY — DX: Essential (primary) hypertension: I10

## 2017-09-23 LAB — BASIC METABOLIC PANEL
Anion gap: 11 (ref 5–15)
BUN: 15 mg/dL (ref 6–20)
CO2: 24 mmol/L (ref 22–32)
CREATININE: 1.14 mg/dL (ref 0.61–1.24)
Calcium: 9.8 mg/dL (ref 8.9–10.3)
Chloride: 104 mmol/L (ref 98–111)
GFR calc non Af Amer: >60 mL/min (ref >60)
Glucose, Bld: 95 mg/dL (ref 70–99)
Potassium: 4.6 mmol/L (ref 3.5–5.1)
Sodium: 139 mmol/L (ref 135–145)

## 2017-09-23 LAB — I-STAT TROPONIN, ED: TROPONIN I, POC: 0.03 ng/mL (ref 0.00–0.08)

## 2017-09-23 LAB — CBC
HCT: 49.1 % (ref 39.0–52.0)
Hemoglobin: 17.2 g/dL — ABNORMAL HIGH (ref 13.0–17.0)
MCH: 30.1 pg (ref 26.0–34.0)
MCHC: 35 g/dL (ref 30.0–36.0)
MCV: 85.8 fL (ref 78.0–100.0)
Platelets: 200 10*3/uL (ref 150–400)
RBC: 5.72 MIL/uL (ref 4.22–5.81)
RDW: 12.7 % (ref 11.5–15.5)
WBC: 8.5 10*3/uL (ref 4.0–10.5)

## 2017-09-23 LAB — D-DIMER, QUANTITATIVE (NOT AT ARMC)

## 2017-09-23 MED ORDER — OXYCODONE HCL 5 MG PO TABS
5.0000 mg | ORAL_TABLET | Freq: Once | ORAL | Status: AC
  Administered 2017-09-23: 5 mg via ORAL
  Filled 2017-09-23: qty 1

## 2017-09-23 MED ORDER — METHOCARBAMOL 500 MG PO TABS: 500.0000 mg | ORAL_TABLET | Freq: Three times a day (TID) | ORAL | 0 refills | Status: DC | PRN

## 2017-09-23 MED ORDER — IOPAMIDOL (ISOVUE-370) INJECTION 76%
INTRAVENOUS | Status: AC
  Filled 2017-09-23: qty 100

## 2017-09-23 MED ORDER — ACETAMINOPHEN 325 MG PO TABS
650.0000 mg | ORAL_TABLET | Freq: Once | ORAL | Status: AC
  Administered 2017-09-23: 650 mg via ORAL
  Filled 2017-09-23: qty 2

## 2017-09-23 MED ORDER — SODIUM CHLORIDE 0.9 % IJ SOLN
INTRAMUSCULAR | Status: AC
  Filled 2017-09-23: qty 50

## 2017-09-23 MED ORDER — IOPAMIDOL (ISOVUE-370) INJECTION 76%
100.0000 mL | Freq: Once | INTRAVENOUS | Status: AC | PRN
  Administered 2017-09-23: 100 mL via INTRAVENOUS

## 2017-09-23 NOTE — Discharge Instructions (Addendum)
You were evaluated in the Emergency Department and after careful evaluation, we did not find any emergent condition requiring admission or further testing in the hospital. ° °Your symptoms today seem to be due to muscle strain.  Please use medications provided as needed for pain. ° °Please return to the Emergency Department if you experience any worsening of your condition.  We encourage you to follow up with a primary care provider.  Thank you for allowing us to be a part of your care. °

## 2017-09-23 NOTE — ED Notes (Signed)
Patient transported to CT 

## 2017-09-23 NOTE — ED Provider Notes (Signed)
[facility] Emergency Department Provider Note MRN:  [id number]  Arrival date & time: 09/23/17     Chief Complaint   Shortness of Breath and Back Pain   History of Present Illness   Gary Soto is a 32 y.o. year-old male with a history of hypertension presenting to the ED with chief complaint of back pain, shortness of breath.  Midthoracic back pain began yesterday, about 24 hours ago.  Sudden onset, initially mild pain but progressively worsening.  Now moderate to severe, described as a dull pain, worse with deep breaths.  Denies recent trauma or new exertional activities, denies chest pain, no fevers, no cough, no abdominal pain.  Sent here from urgent care to be evaluated for pulmonary embolism.  Review of Systems  A complete 10 system review of systems was obtained and all systems are negative except as noted in the HPI and PMH.   Patient's Health History    Past Medical History:  Diagnosis Date  . Hypertension     History reviewed. No pertinent surgical history.  Family History  Problem Relation Age of Onset  . Hypertension Mother   . Hyperlipidemia Mother   . Hypertension Maternal Grandmother   . Hyperlipidemia Maternal Grandmother   . Diabetes Maternal Grandmother   . Heart disease Maternal Grandmother     Social History   Socioeconomic History  . Marital status: Single    Spouse name: Not on file  . Number of children: Not on file  . Years of education: Not on file  . Highest education level: Not on file  Occupational History  . Not on file  Social Needs  . Financial resource strain: Not on file  . Food insecurity:    Worry: Not on file    Inability: Not on file  . Transportation needs:    Medical: Not on file    Non-medical: Not on file  Tobacco Use  . Smoking status: Never Smoker  . Smokeless tobacco: Never Used  Substance and Sexual Activity  . Alcohol use: Yes    Comment: 1/5 liquor 2-3x/week  . Drug use: Not Currently   Types: Marijuana  . Sexual activity: Yes  Lifestyle  . Physical activity:    Days per week: Not on file    Minutes per session: Not on file  . Stress: Not on file  Relationships  . Social connections:    Talks on phone: Not on file    Gets together: Not on file    Attends religious service: Not on file    Active member of club or organization: Not on file    Attends meetings of clubs or organizations: Not on file    Relationship status: Not on file  . Intimate partner violence:    Fear of current or ex partner: Not on file    Emotionally abused: Not on file    Physically abused: Not on file    Forced sexual activity: Not on file  Other Topics Concern  . Not on file  Social History Narrative  . Not on file     Physical Exam  Vital Signs and Nursing Notes reviewed Vitals:   09/23/17 1900 09/23/17 1930  BP: 130/76 112/62  Pulse: 67 70  Resp: 17 17  Temp:    SpO2: 98% 98%    CONSTITUTIONAL: Well-appearing, NAD NEURO:  Alert and oriented x 3, no focal deficits EYES:  eyes equal and reactive ENT/NECK:  no LAD, no JVD CARDIO: Regular rate, well-perfused,  normal S1 and S2 PULM:  CTAB no wheezing or rhonchi GI/GU:  normal bowel sounds, non-distended, non-tender MSK/SPINE:  No gross deformities, no edema, no thoracic tenderness to palpation SKIN:  no rash, atraumatic PSYCH:  Appropriate speech and behavior  Diagnostic and Interventional Summary    EKG Interpretation  Date/Time:  Monday September 23 2017 16:52:06 EDT Ventricular Rate:  81 PR Interval:    QRS Duration: 93 QT Interval:  341 QTC Calculation: 396 R Axis:   -55 Text Interpretation:  Sinus rhythm LAD, consider left anterior fascicular block Borderline ST elevation, anterior leads Baseline wander in lead(s) V5 V6 Confirmed by Kennis Carina [phone]) on 09/23/2017 8:29:41 PM      Labs Reviewed  CBC - Abnormal; Notable for the following components:      Result Value   Hemoglobin 17.2 (*)    All other  components within normal limits  BASIC METABOLIC PANEL  D-DIMER, QUANTITATIVE (NOT AT [facility])  I-STAT TROPONIN, ED    CT Angio Chest Aorta W and/or Wo Contrast  Final Result    DG Chest 2 View  Final Result      Medications  sodium chloride 0.9 % injection (has no administration in time range)  oxyCODONE (Oxy IR/ROXICODONE) immediate release tablet 5 mg (5 mg Oral Given 09/23/17 1715)  acetaminophen (TYLENOL) tablet 650 mg (650 mg Oral Given 09/23/17 1714)  iopamidol (ISOVUE-370) 76 % injection 100 mL (100 mLs Intravenous Contrast Given 09/23/17 1940)     Procedures Critical Care  ED Course and Medical Decision Making  I have reviewed the triage vital signs and the nursing notes.  Pertinent labs & imaging results that were available during my care of the patient were reviewed by me and considered in my medical decision making (see below for details).  MSK versus PE versus spontaneous pneumothorax in this otherwise healthy 32 year old male.  Labs, d-dimer, chest x-ray pending. Clinical Course as of Sep 23 2029  Mon Sep 23, 2017  1800 Abnormal EKG, cardiomegaly on chest x-ray, cannot exclude aortic pathology.  Will CTA.   [MB]    Clinical Course User Index [MB] Sabas Sous, MD    CTA unremarkable, troponin negative.  Patient advised to follow-up with PCP to discuss the mild cardiomegaly.  Prescription for Robaxin.  After the discussed management above, the patient was determined to be safe for discharge.  The patient was in agreement with this plan and all questions regarding their care were answered.  ED return precautions were discussed and the patient will return to the ED with any significant worsening of condition.  [name], MD [facility] Emergency Medicine [facility] [email or web address]  Final Clinical Impressions(s) / ED Diagnoses     ICD-10-CM   1. Acute midline thoracic back pain M54.6   2. SOB (shortness of breath) R06.02 DG Chest 2 View      DG Chest 2 View    ED Discharge Orders    None         Sabas Sous, MD 09/23/17 2032

## 2017-09-23 NOTE — ED Notes (Signed)
ED Provider at bedside. 

## 2017-09-23 NOTE — ED Triage Notes (Signed)
Patient c/o upper back pain and unable to take a deep breath. Taking a deep breath causes increased pain in the upper back. Patient was at an Urgent Care and was sent to the ED to rule out a PE.

## 2017-12-10 ENCOUNTER — Ambulatory Visit (INDEPENDENT_AMBULATORY_CARE_PROVIDER_SITE_OTHER): Payer: Managed Care, Other (non HMO) | Admitting: Family Medicine

## 2017-12-10 ENCOUNTER — Encounter: Payer: Self-pay | Admitting: Family Medicine

## 2017-12-10 VITALS — BP 128/80 | HR 89 | Ht 72.0 in | Wt 238.2 lb

## 2017-12-10 DIAGNOSIS — I10 Essential (primary) hypertension: Secondary | ICD-10-CM

## 2017-12-10 DIAGNOSIS — Z0000 Encounter for general adult medical examination without abnormal findings: Secondary | ICD-10-CM

## 2017-12-10 DIAGNOSIS — F322 Major depressive disorder, single episode, severe without psychotic features: Secondary | ICD-10-CM

## 2017-12-10 LAB — LIPID PANEL
CHOL/HDL RATIO: 3
Cholesterol: 162 mg/dL (ref 0–200)
HDL: 58.6 mg/dL (ref >39.00)
LDL Cholesterol: 88 mg/dL (ref 0–99)
NONHDL: 103.09
Triglycerides: 76 mg/dL (ref 0.0–149.0)
VLDL: 15.2 mg/dL (ref 0.0–40.0)

## 2017-12-10 LAB — URINALYSIS, ROUTINE W REFLEX MICROSCOPIC
Bilirubin Urine: NEGATIVE
Hgb urine dipstick: NEGATIVE
Ketones, ur: NEGATIVE
LEUKOCYTES UA: NEGATIVE
Nitrite: NEGATIVE
PH: 6.5 (ref 5.0–8.0)
RBC / HPF: NONE SEEN (ref 0–?)
Specific Gravity, Urine: 1.02 (ref 1.000–1.030)
TOTAL PROTEIN, URINE-UPE24: NEGATIVE
URINE GLUCOSE: NEGATIVE
UROBILINOGEN UA: 1 (ref 0.0–1.0)

## 2017-12-10 LAB — COMPREHENSIVE METABOLIC PANEL
ALT: 29 U/L (ref 0–53)
AST: 21 U/L (ref 0–37)
Albumin: 4.4 g/dL (ref 3.5–5.2)
Alkaline Phosphatase: 51 U/L (ref 39–117)
BILIRUBIN TOTAL: 0.4 mg/dL (ref 0.2–1.2)
BUN: 21 mg/dL (ref 6–23)
CO2: 23 mEq/L (ref 19–32)
CREATININE: 0.99 mg/dL (ref 0.40–1.50)
Calcium: 9.4 mg/dL (ref 8.4–10.5)
Chloride: 106 mEq/L (ref 96–112)
GFR: 113.07 mL/min (ref >60.00)
GLUCOSE: 88 mg/dL (ref 70–99)
Potassium: 3.8 mEq/L (ref 3.5–5.1)
SODIUM: 137 meq/L (ref 135–145)
Total Protein: 7.3 g/dL (ref 6.0–8.3)

## 2017-12-10 LAB — CBC
HCT: 44.9 % (ref 39.0–52.0)
Hemoglobin: 14.9 g/dL (ref 13.0–17.0)
MCHC: 33.1 g/dL (ref 30.0–36.0)
MCV: 87.2 fl (ref 78.0–100.0)
PLATELETS: 249 10*3/uL (ref 150.0–400.0)
RBC: 5.16 Mil/uL (ref 4.22–5.81)
RDW: 13.3 % (ref 11.5–15.5)
WBC: 7.4 10*3/uL (ref 4.0–10.5)

## 2017-12-10 MED ORDER — LISINOPRIL 5 MG PO TABS: 5.0000 mg | ORAL_TABLET | Freq: Every day | ORAL | 0 refills | Status: DC

## 2017-12-10 NOTE — Progress Notes (Signed)
New Patient Office Visit  Subjective:  Patient ID: Gary Soto, male    DOB: [birth year]  Age: 32 y.o. MRN: [id number]  CC:  Chief Complaint  Patient presents with  . Establish Care    HPI Gary Soto presents for establishment of care. He is taking all of his medications as directed. His mother has a history of hypertension and hyperlipidemia. Today his blood pressure is normal.  Patient is here for follow-up of his blood pressure is not elevated cholesterol.  Typically his pressure runs in the 140s over 90s.  He says that it has responded well to the lisinopril.  His mother has a history of hypertension and elevated patient has been exercising and watching the sodium in his diet.  He works at [employer].  He lives with his wife.  He is not using any street drugs currently.  Or smoking.  He rarely drinks alcohol.  He is fasting today.  He is seeing psychiatry for his depression.  Zoloft was recently increased to 100 mg just this past week.  He has never thought seriously about self-harm as much as he is thought things might be better if he were not here.  Past Medical History:  Diagnosis Date  . Hypertension     History reviewed. No pertinent surgical history.  Family History  Problem Relation Age of Onset  . Hypertension Mother   . Hyperlipidemia Mother   . Hypertension Maternal Grandmother   . Hyperlipidemia Maternal Grandmother   . Diabetes Maternal Grandmother   . Heart disease Maternal Grandmother     Social History   Socioeconomic History  . Marital status: Single    Spouse name: Not on file  . Number of children: Not on file  . Years of education: Not on file  . Highest education level: Not on file  Occupational History  . Not on file  Social Needs  . Financial resource strain: Not on file  . Food insecurity:    Worry: Not on file    Inability: Not on file  . Transportation needs:    Medical: Not on file    Non-medical: Not on file  Tobacco Use  . Smoking  status: Never Smoker  . Smokeless tobacco: Never Used  Substance and Sexual Activity  . Alcohol use: Yes    Comment: 1/5 liquor 2-3x/week  . Drug use: Not Currently    Types: Marijuana  . Sexual activity: Yes  Lifestyle  . Physical activity:    Days per week: Not on file    Minutes per session: Not on file  . Stress: Not on file  Relationships  . Social connections:    Talks on phone: Not on file    Gets together: Not on file    Attends religious service: Not on file    Active member of club or organization: Not on file    Attends meetings of clubs or organizations: Not on file    Relationship status: Not on file  . Intimate partner violence:    Fear of current or ex partner: Not on file    Emotionally abused: Not on file    Physically abused: Not on file    Forced sexual activity: Not on file  Other Topics Concern  . Not on file  Social History Narrative  . Not on file    ROS Review of Systems  Constitutional: Negative for fatigue, fever and unexpected weight change.  HENT: Negative.   Eyes: Negative for photophobia and visual  disturbance.  Respiratory: Negative.   Cardiovascular: Negative.   Gastrointestinal: Negative.   Endocrine: Negative for polyphagia and polyuria.  Genitourinary: Negative.   Musculoskeletal: Negative for gait problem and joint swelling.  Allergic/Immunologic: Negative for immunocompromised state.  Neurological: Negative for headaches.  Hematological: Does not bruise/bleed easily.  Psychiatric/Behavioral: Positive for dysphoric mood. The patient is nervous/anxious.    Dep[facility] screen [facility] 12/10/2017 03/19/2016 09/12/2015  Decreased Interest 2 0 0  Down, Depressed, Hopeless 1 0 0  PHQ - 2 Score 3 0 0  Altered sleeping 0 - -  Tired, decreased energy 2 - -  Change in appetite 3 - -  Feeling bad or failure about yourself  2 - -  Trouble concentrating 1 - -  Moving slowly or fidgety/restless 3 - -  Suicidal thoughts 1 - -  PHQ-9 Score 15  - -     Objective:   Today's Vitals: BP 128/80   Pulse 89   Ht 6' (1.829 m)   Wt 238 lb 4 oz (108.1 kg)   SpO2 97%   BMI 32.31 kg/m    BP Readings from Last 3 Encounters:  12/10/17 128/80  09/23/17 128/81  05/31/17 121/83   Physical Exam  Constitutional: He is oriented to person, place, and time. He appears well-developed and well-nourished. No distress.  HENT:  Head: Normocephalic and atraumatic.  Right Ear: External ear normal.  Left Ear: External ear normal.  Mouth/Throat: Oropharynx is clear and moist. No oropharyngeal exudate.  Eyes: Pupils are equal, round, and reactive to light. Conjunctivae and EOM are normal. Right eye exhibits no discharge. Left eye exhibits no discharge. No scleral icterus.  Neck: Neck supple. No JVD present. No tracheal deviation present. No thyromegaly present.  Cardiovascular: Normal rate, regular rhythm and normal heart sounds.  Pulmonary/Chest: Effort normal and breath sounds normal.  Abdominal: Soft. Bowel sounds are normal.  Musculoskeletal: He exhibits no edema.  Lymphadenopathy:    He has no cervical adenopathy.  Neurological: He is alert and oriented to person, place, and time.  Skin: Skin is warm and dry. He is not diaphoretic.  Psychiatric: He has a normal mood and affect. His behavior is normal.    Assessment & Plan:   Problem List Items Addressed This Visit      Cardiovascular and Mediastinum   Essential hypertension   Relevant Medications   lisinopril (PRINIVIL,ZESTRIL) 5 MG tablet     Other   MDD (major depressive disorder), severe (HCC) - Primary   Relevant Medications   sertraline (ZOLOFT) 100 MG tablet   Healthcare maintenance   Relevant Orders   CBC   Comprehensive metabolic panel   Lipid panel   Urinalysis, Routine w reflex microscopic      Outpatient Encounter Medications as of 12/10/2017  Medication Sig  . hydrOXYzine (ATARAX/VISTARIL) 25 MG tablet Take 1 tablet (25 mg total) by mouth 3 (three) times  daily as needed for anxiety.  . methocarbamol (ROBAXIN) 500 MG tablet Take 1 tablet (500 mg total) by mouth every 8 (eight) hours as needed for muscle spasms.  Marland Kitchen. sertraline (ZOLOFT) 100 MG tablet Take 100 mg by mouth daily.  . traZODone (DESYREL) 50 MG tablet Take 1 tablet (50 mg total) by mouth at bedtime as needed for sleep.  . [DISCONTINUED] lisinopril (PRINIVIL,ZESTRIL) 2.5 MG tablet Take 1 tablet (2.5 mg total) by mouth daily.  Marland Kitchen. lisinopril (PRINIVIL,ZESTRIL) 5 MG tablet Take 1 tablet (5 mg total) by mouth daily.  . [DISCONTINUED] sertraline (ZOLOFT) 50  MG tablet Take 1 tablet (50 mg total) by mouth daily.   No facility-administered encounter medications on file as of 12/10/2017.     Follow-up: Return in about 4 weeks (around 01/07/2018).   Patient has been on a homeopathic dose of lisinopril.  His blood pressure looks okay today but he says that it runs higher most of the time.  We will bump him up to the 5 mg pill which is still low dose.  He will check and record his blood pressures over the next month while taking this medication.  Routine physical blood work obtained today.  Will follow back up with psychiatry as they have recommended.  Advised him to go ahead and discontinue all alcohol.

## 2017-12-10 NOTE — Patient Instructions (Signed)
DASH Eating Plan DASH stands for "Dietary Approaches to Stop Hypertension." The DASH eating plan is a healthy eating plan that has been shown to reduce high blood pressure (hypertension). It may also reduce your risk for type 2 diabetes, heart disease, and stroke. The DASH eating plan may also help with weight loss. What are tips for following this plan? General guidelines  Avoid eating more than 2,300 mg (milligrams) of salt (sodium) a day. If you have hypertension, you may need to reduce your sodium intake to 1,500 mg a day.  Limit alcohol intake to no more than 1 drink a day for nonpregnant women and 2 drinks a day for men. One drink equals 12 oz of beer, 5 oz of wine, or 1 oz of hard liquor.  Work with your health care provider to maintain a healthy body weight or to lose weight. Ask what an ideal weight is for you.  Get at least 30 minutes of exercise that causes your heart to beat faster (aerobic exercise) most days of the week. Activities may include walking, swimming, or biking.  Work with your health care provider or diet and nutrition specialist (dietitian) to adjust your eating plan to your individual calorie needs. Reading food labels  Check food labels for the amount of sodium per serving. Choose foods with less than 5 percent of the Daily Value of sodium. Generally, foods with less than 300 mg of sodium per serving fit into this eating plan.  To find whole grains, look for the word "whole" as the first word in the ingredient list. Shopping  Buy products labeled as "low-sodium" or "no salt added."  Buy fresh foods. Avoid canned foods and premade or frozen meals. Cooking  Avoid adding salt when cooking. Use salt-free seasonings or herbs instead of table salt or sea salt. Check with your health care provider or pharmacist before using salt substitutes.  Do not fry foods. Cook foods using healthy methods such as baking, boiling, grilling, and broiling instead.  Cook with  heart-healthy oils, such as olive, canola, soybean, or sunflower oil. Meal planning   Eat a balanced diet that includes: ? 5 or more servings of fruits and vegetables each day. At each meal, try to fill half of your plate with fruits and vegetables. ? Up to 6-8 servings of whole grains each day. ? Less than 6 oz of lean meat, poultry, or fish each day. A 3-oz serving of meat is about the same size as a deck of cards. One egg equals 1 oz. ? 2 servings of low-fat dairy each day. ? A serving of nuts, seeds, or beans 5 times each week. ? Heart-healthy fats. Healthy fats called Omega-3 fatty acids are found in foods such as flaxseeds and coldwater fish, like sardines, salmon, and mackerel.  Limit how much you eat of the following: ? Canned or prepackaged foods. ? Food that is high in trans fat, such as fried foods. ? Food that is high in saturated fat, such as fatty meat. ? Sweets, desserts, sugary drinks, and other foods with added sugar. ? Full-fat dairy products.  Do not salt foods before eating.  Try to eat at least 2 vegetarian meals each week.  Eat more home-cooked food and less restaurant, buffet, and fast food.  When eating at a restaurant, ask that your food be prepared with less salt or no salt, if possible. What foods are recommended? The items listed may not be a complete list. Talk with your dietitian about what   dietary choices are best for you. Grains Whole-grain or whole-wheat bread. Whole-grain or whole-wheat pasta. Brown rice. Oatmeal. Quinoa. Bulgur. Whole-grain and low-sodium cereals. Pita bread. Low-fat, low-sodium crackers. Whole-wheat flour tortillas. Vegetables Fresh or frozen vegetables (raw, steamed, roasted, or grilled). Low-sodium or reduced-sodium tomato and vegetable juice. Low-sodium or reduced-sodium tomato sauce and tomato paste. Low-sodium or reduced-sodium canned vegetables. Fruits All fresh, dried, or frozen fruit. Canned fruit in natural juice (without  added sugar). Meat and other protein foods Skinless chicken or turkey. Ground chicken or turkey. Pork with fat trimmed off. Fish and seafood. Egg whites. Dried beans, peas, or lentils. Unsalted nuts, nut butters, and seeds. Unsalted canned beans. Lean cuts of beef with fat trimmed off. Low-sodium, lean deli meat. Dairy Low-fat (1%) or fat-free (skim) milk. Fat-free, low-fat, or reduced-fat cheeses. Nonfat, low-sodium ricotta or cottage cheese. Low-fat or nonfat yogurt. Low-fat, low-sodium cheese. Fats and oils Soft margarine without trans fats. Vegetable oil. Low-fat, reduced-fat, or light mayonnaise and salad dressings (reduced-sodium). Canola, safflower, olive, soybean, and sunflower oils. Avocado. Seasoning and other foods Herbs. Spices. Seasoning mixes without salt. Unsalted popcorn and pretzels. Fat-free sweets. What foods are not recommended? The items listed may not be a complete list. Talk with your dietitian about what dietary choices are best for you. Grains Baked goods made with fat, such as croissants, muffins, or some breads. Dry pasta or rice meal packs. Vegetables Creamed or fried vegetables. Vegetables in a cheese sauce. Regular canned vegetables (not low-sodium or reduced-sodium). Regular canned tomato sauce and paste (not low-sodium or reduced-sodium). Regular tomato and vegetable juice (not low-sodium or reduced-sodium). Pickles. Olives. Fruits Canned fruit in a light or heavy syrup. Fried fruit. Fruit in cream or butter sauce. Meat and other protein foods Fatty cuts of meat. Ribs. Fried meat. Bacon. Sausage. Bologna and other processed lunch meats. Salami. Fatback. Hotdogs. Bratwurst. Salted nuts and seeds. Canned beans with added salt. Canned or smoked fish. Whole eggs or egg yolks. Chicken or turkey with skin. Dairy Whole or 2% milk, cream, and half-and-half. Whole or full-fat cream cheese. Whole-fat or sweetened yogurt. Full-fat cheese. Nondairy creamers. Whipped toppings.  Processed cheese and cheese spreads. Fats and oils Butter. Stick margarine. Lard. Shortening. Ghee. Bacon fat. Tropical oils, such as coconut, palm kernel, or palm oil. Seasoning and other foods Salted popcorn and pretzels. Onion salt, garlic salt, seasoned salt, table salt, and sea salt. Worcestershire sauce. Tartar sauce. Barbecue sauce. Teriyaki sauce. Soy sauce, including reduced-sodium. Steak sauce. Canned and packaged gravies. Fish sauce. Oyster sauce. Cocktail sauce. Horseradish that you find on the shelf. Ketchup. Mustard. Meat flavorings and tenderizers. Bouillon cubes. Hot sauce and Tabasco sauce. Premade or packaged marinades. Premade or packaged taco seasonings. Relishes. Regular salad dressings. Where to find more information:  National Heart, Lung, and Blood Institute: www.nhlbi.nih.gov  American Heart Association: www.heart.org Summary  The DASH eating plan is a healthy eating plan that has been shown to reduce high blood pressure (hypertension). It may also reduce your risk for type 2 diabetes, heart disease, and stroke.  With the DASH eating plan, you should limit salt (sodium) intake to 2,300 mg a day. If you have hypertension, you may need to reduce your sodium intake to 1,500 mg a day.  When on the DASH eating plan, aim to eat more fresh fruits and vegetables, whole grains, lean proteins, low-fat dairy, and heart-healthy fats.  Work with your health care provider or diet and nutrition specialist (dietitian) to adjust your eating plan to your individual   calorie needs. This information is not intended to replace advice given to you by your health care provider. Make sure you discuss any questions you have with your health care provider. Document Released: 12/21/2010 Document Revised: 12/26/2015 Document Reviewed: 12/26/2015 Elsevier Interactive Patient Education  2018 [employer] Inc.  Health Maintenance, Male A healthy lifestyle and preventive care is important for your  health and wellness. Ask your health care provider about what schedule of regular examinations is right for you. What should I know about weight and diet? Eat a Healthy Diet  Eat plenty of vegetables, fruits, whole grains, low-fat dairy products, and lean protein.  Do not eat a lot of foods high in solid fats, added sugars, or salt.  Maintain a Healthy Weight Regular exercise can help you achieve or maintain a healthy weight. You should:  Do at least 150 minutes of exercise each week. The exercise should increase your heart rate and make you sweat (moderate-intensity exercise).  Do strength-training exercises at least twice a week.  Watch Your Levels of Cholesterol and Blood Lipids  Have your blood tested for lipids and cholesterol every 5 years starting at 31 years of age. If you are at high risk for heart disease, you should start having your blood tested when you are 31 years old. You may need to have your cholesterol levels checked more often if: ? Your lipid or cholesterol levels are high. ? You are older than 31 years of age. ? You are at high risk for heart disease.  What should I know about cancer screening? Many types of cancers can be detected early and may often be prevented. Lung Cancer  You should be screened every year for lung cancer if: ? You are a current smoker who has smoked for at least 30 years. ? You are a former smoker who has quit within the past 15 years.  Talk to your health care provider about your screening options, when you should start screening, and how often you should be screened.  Colorectal Cancer  Routine colorectal cancer screening usually begins at 31 years of age and should be repeated every 5-10 years until you are 31 years old. You may need to be screened more often if early forms of precancerous polyps or small growths are found. Your health care provider may recommend screening at an earlier age if you have risk factors for colon  cancer.  Your health care provider may recommend using home test kits to check for hidden blood in the stool.  A small camera at the end of a tube can be used to examine your colon (sigmoidoscopy or colonoscopy). This checks for the earliest forms of colorectal cancer.  Prostate and Testicular Cancer  Depending on your age and overall health, your health care provider may do certain tests to screen for prostate and testicular cancer.  Talk to your health care provider about any symptoms or concerns you have about testicular or prostate cancer.  Skin Cancer  Check your skin from head to toe regularly.  Tell your health care provider about any new moles or changes in moles, especially if: ? There is a change in a mole's size, shape, or color. ? You have a mole that is larger than a pencil eraser.  Always use sunscreen. Apply sunscreen liberally and repeat throughout the day.  Protect yourself by wearing long sleeves, pants, a wide-brimmed hat, and sunglasses when outside.  What should I know about heart disease, diabetes, and high blood pressure?    If you are 18-39 years of age, have your blood pressure checked every 3-5 years. If you are 40 years of age or older, have your blood pressure checked every year. You should have your blood pressure measured twice-once when you are at a hospital or clinic, and once when you are not at a hospital or clinic. Record the average of the two measurements. To check your blood pressure when you are not at a hospital or clinic, you can use: ? An automated blood pressure machine at a pharmacy. ? A home blood pressure monitor.  Talk to your health care provider about your target blood pressure.  If you are between 45-79 years old, ask your health care provider if you should take aspirin to prevent heart disease.  Have regular diabetes screenings by checking your fasting blood sugar level. ? If you are at a normal weight and have a low risk for  diabetes, have this test once every three years after the age of 45. ? If you are overweight and have a high risk for diabetes, consider being tested at a younger age or more often.  A one-time screening for abdominal aortic aneurysm (AAA) by ultrasound is recommended for men aged 65-75 years who are current or former smokers. What should I know about preventing infection? Hepatitis B If you have a higher risk for hepatitis B, you should be screened for this virus. Talk with your health care provider to find out if you are at risk for hepatitis B infection. Hepatitis C Blood testing is recommended for:  Everyone born from 1945 through 1965.  Anyone with known risk factors for hepatitis C.  Sexually Transmitted Diseases (STDs)  You should be screened each year for STDs including gonorrhea and chlamydia if: ? You are sexually active and are younger than 31 years of age. ? You are older than 31 years of age and your health care provider tells you that you are at risk for this type of infection. ? Your sexual activity has changed since you were last screened and you are at an increased risk for chlamydia or gonorrhea. Ask your health care provider if you are at risk.  Talk with your health care provider about whether you are at high risk of being infected with HIV. Your health care provider may recommend a prescription medicine to help prevent HIV infection.  What else can I do?  Schedule regular health, dental, and eye exams.  Stay current with your vaccines (immunizations).  Do not use any tobacco products, such as cigarettes, chewing tobacco, and e-cigarettes. If you need help quitting, ask your health care provider.  Limit alcohol intake to no more than 2 drinks per day. One drink equals 12 ounces of beer, 5 ounces of wine, or 1 ounces of hard liquor.  Do not use street drugs.  Do not share needles.  Ask your health care provider for help if you need support or information about  quitting drugs.  Tell your health care provider if you often feel depressed.  Tell your health care provider if you have ever been abused or do not feel safe at home. This information is not intended to replace advice given to you by your health care provider. Make sure you discuss any questions you have with your health care provider. Document Released: 06/30/2007 Document Revised: 08/31/2015 Document Reviewed: 10/05/2014 Elsevier Interactive Patient Education  2018 [employer] Inc.  

## 2018-01-03 ENCOUNTER — Encounter: Payer: Self-pay | Admitting: Family Medicine

## 2018-01-03 ENCOUNTER — Ambulatory Visit (INDEPENDENT_AMBULATORY_CARE_PROVIDER_SITE_OTHER): Payer: Managed Care, Other (non HMO) | Admitting: Family Medicine

## 2018-01-03 DIAGNOSIS — H00014 Hordeolum externum left upper eyelid: Secondary | ICD-10-CM

## 2018-01-03 NOTE — Assessment & Plan Note (Signed)
-  Some improvement today per patient -Recommend continued warm compresses, use consistently 3-4x per day. -He will call if worsening or fails to resolve over the next few days.

## 2018-01-03 NOTE — Progress Notes (Signed)
Gary Soto - 32 y.o. male MRN [id number]  Date of birth: 1985/11/26  Subjective Chief Complaint  Patient presents with  . Stye    has not improved-has been ongoing for one week    HPI Gary Soto is a 32 y.o. male here today with complaint of swelling of L upper eyelid.  Has had some pain with this as well.  Symptoms started about 1 week ago.  He has been using lubricating drops which have provided some relief.  He has been using warm compress but not consistently.  Pain has improved some today and swelling is diminished some.  He denies fever, chills or vision obstruction related to this.    ROS:  A comprehensive ROS was completed and negative except as noted per HPI  No Known Allergies  Past Medical History:  Diagnosis Date  . Hypertension     No past surgical history on file.  Social History   Socioeconomic History  . Marital status: Single    Spouse name: Not on file  . Number of children: Not on file  . Years of education: Not on file  . Highest education level: Not on file  Occupational History  . Not on file  Social Needs  . Financial resource strain: Not on file  . Food insecurity:    Worry: Not on file    Inability: Not on file  . Transportation needs:    Medical: Not on file    Non-medical: Not on file  Tobacco Use  . Smoking status: Never Smoker  . Smokeless tobacco: Never Used  Substance and Sexual Activity  . Alcohol use: Yes    Comment: 1/5 liquor 2-3x/week  . Drug use: Not Currently    Types: Marijuana  . Sexual activity: Yes  Lifestyle  . Physical activity:    Days per week: Not on file    Minutes per session: Not on file  . Stress: Not on file  Relationships  . Social connections:    Talks on phone: Not on file    Gets together: Not on file    Attends religious service: Not on file    Active member of club or organization: Not on file    Attends meetings of clubs or organizations: Not on file    Relationship status: Not on file    Other Topics Concern  . Not on file  Social History Narrative  . Not on file    Family History  Problem Relation Age of Onset  . Hypertension Mother   . Hyperlipidemia Mother   . Hypertension Maternal Grandmother   . Hyperlipidemia Maternal Grandmother   . Diabetes Maternal Grandmother   . Heart disease Maternal Grandmother     Health Maintenance  Topic Date Due  . TETANUS/TDAP  07/02/2005  . INFLUENZA VACCINE  04/15/2018 (Originally 08/15/2017)  . HIV Screening  Completed    ----------------------------------------------------------------------------------------------------------------------------------------------------------------------------------------------------------------- Physical Exam BP 138/74   Pulse 72   Temp 98 F (36.7 C) (Oral)   Ht 6' (1.829 m)   Wt 241 lb (109.3 kg)   SpO2 97%   BMI 32.69 kg/m   Physical Exam Constitutional:      Appearance: Normal appearance.  HENT:     Head: Normocephalic and atraumatic.     Mouth/Throat:     Mouth: Mucous membranes are moist.  Eyes:     General:        Right eye: No discharge.        Left eye: No discharge.  Extraocular Movements: Extraocular movements intact.     Conjunctiva/sclera: Conjunctivae normal.     Pupils: Pupils are equal, round, and reactive to light.     Comments: Hordeolum to L upper eyelid.  Mildly ttp.   Lymphadenopathy:     Cervical: No cervical adenopathy.  Neurological:     Mental Status: He is alert.  Psychiatric:        Mood and Affect: Mood normal.        Behavior: Behavior normal.     ------------------------------------------------------------------------------------------------------------------------------------------------------------------------------------------------------------------- Assessment and Plan  Hordeolum externum left upper eyelid -Some improvement today per patient -Recommend continued warm compresses, use consistently 3-4x per day. -He will call if  worsening or fails to resolve over the next few days.

## 2018-01-03 NOTE — Patient Instructions (Signed)
If not continuing to improve please let us know!  Stye  A stye, also known as a hordeolum, is a bump that forms on an eyelid. It may look like a pimple next to the eyelash. A stye can form inside the eyelid (internal stye) or outside the eyelid (external stye). A stye can cause redness, swelling, and pain on the eyelid. Styes are very common. Anyone can get them at any age. They usually occur in just one eye, but you may have more than one in either eye. What are the causes? A stye is caused by an infection. The infection is almost always caused by bacteria called Staphylococcus aureus. This is a common type of bacteria that lives on the skin. An internal stye may result from an infected oil-producing gland inside the eyelid. An external stye may be caused by an infection at the base of the eyelash (hair follicle). What increases the risk? You are more likely to develop a stye if:  You have had a stye before.  You have any of these conditions: ? Diabetes. ? Red, itchy, inflamed eyelids (blepharitis). ? A skin condition such as seborrheic dermatitis or rosacea. ? High fat levels in your blood (lipids). What are the signs or symptoms? The most common symptom of a stye is eyelid pain. Internal styes are more painful than external styes. Other symptoms may include:  Painful swelling of your eyelid.  A scratchy feeling in your eye.  Tearing and redness of your eye.  Pus draining from the stye. How is this diagnosed? Your health care provider may be able to diagnose a stye just by examining your eye. The health care provider may also check to make sure:  You do not have a fever or other signs of a more serious infection.  The infection has not spread to other parts of your eye or areas around your eye. How is this treated? Most styes will clear up in a few days without treatment or with warm compresses applied to the area. You may need to use antibiotic drops or ointment to treat an  infection. In some cases, if your stye does not heal with routine treatment, your health care provider may drain pus from the stye using a thin blade or needle. This may be done if the stye is large, causing a lot of pain, or affecting your vision. Follow these instructions at home:  Take over-the-counter and prescription medicines only as told by your health care provider. This includes eye drops or ointments.  If you were prescribed an antibiotic medicine, apply or use it as told by your health care provider. Do not stop using the antibiotic even if your condition improves.  Apply a warm, wet cloth (warm compress) to your eye for 5-10 minutes, 4 times a day.  Clean the affected eyelid as directed by your health care provider.  Do not wear contact lenses or eye makeup until your stye has healed.  Do not try to pop or drain the stye.  Do not rub your eye. Contact a health care provider if:  You have chills or a fever.  Your stye does not go away after several days.  Your stye affects your vision.  Your eyeball becomes swollen, red, or painful. Get help right away if:  You have pain when moving your eye around. Summary  A stye is a bump that forms on an eyelid. It may look like a pimple next to the eyelash.  A stye can form  inside the eyelid (internal stye) or outside the eyelid (external stye). A stye can cause redness, swelling, and pain on the eyelid.  Your health care provider may be able to diagnose a stye just by examining your eye.  Apply a warm, wet cloth (warm compress) to your eye for 5-10 minutes, 4 times a day. This information is not intended to replace advice given to you by your health care provider. Make sure you discuss any questions you have with your health care provider. Document Released: 10/11/2004 Document Revised: 09/13/2016 Document Reviewed: 09/13/2016 Elsevier Interactive Patient Education  2019 [employer]

## 2018-01-23 ENCOUNTER — Ambulatory Visit: Payer: Self-pay | Admitting: *Deleted

## 2018-01-23 NOTE — Telephone Encounter (Signed)
Pt calling with complaints of feeling lightheaded since last night. While on the phone with triage nurse pt reports that his BP is 152/87 and HR is 81 via watch. Pt states that he has been taking Lisinopril 5 mg at night before going to work.Pt reports that prior to calling the office his BP was 98/83 and his SBP usually runs in the 140s.  Pt advised pt to check BP tonight before taking Lisinopril. Pt scheduled for appt with Dr. Doreene Burke on tomorrow. Pt advised to seek treatment in the ED with worsening symptoms. Pt verbalized understanding.  Reason for Disposition . Taking a medicine that could cause dizziness (e.g., blood pressure medications, diuretics)  Answer Assessment - Initial Assessment Questions 1. DESCRIPTION: "Describe your dizziness."      2. LIGHTHEADED: "Do you feel lightheaded?" (e.g., somewhat faint, woozy, weak upon standing)     yes 3. VERTIGO: "Do you feel like either you or the room is spinning or tilting?" (i.e. vertigo)     History of vertigo but does not feel like the room is splinting or tilting at this time 4. SEVERITY: "How bad is it?"  "Do you feel like you are going to faint?" "Can you stand and walk?"   - MILD - walking normally   - MODERATE - interferes with normal activities (e.g., work, school)    - SEVERE - unable to stand, requires support to walk, feels like passing out now.      Laying down on back at this time, so he doesn't feel lightheaded 5. ONSET:  "When did the dizziness begin?"     Last  6. AGGRAVATING FACTORS: "Does anything make it worse?" (e.g., standing, change in head position)     Standing and doing activity 7. HEART RATE: "Can you tell me your heart rate?" "How many beats in 15 seconds?"  (Note: not all patients can do this)       HR checked with watch and is 81 8. CAUSE: "What do you think is causing the dizziness?"     Not sure 9. RECURRENT SYMPTOM: "Have you had dizziness before?" If so, ask: "When was the last time?" "What happened that  time?"     No like this 10. OTHER SYMPTOMS: "Do you have any other symptoms?" (e.g., fever, chest pain, vomiting, diarrhea, bleeding)       No  Protocols used: DIZZINESS Tereasa Coop

## 2018-01-24 ENCOUNTER — Ambulatory Visit (INDEPENDENT_AMBULATORY_CARE_PROVIDER_SITE_OTHER): Payer: Managed Care, Other (non HMO) | Admitting: Family Medicine

## 2018-01-24 ENCOUNTER — Encounter: Payer: Self-pay | Admitting: Family Medicine

## 2018-01-24 VITALS — BP 122/78 | HR 77 | Ht 72.0 in | Wt 241.2 lb

## 2018-01-24 DIAGNOSIS — R6889 Other general symptoms and signs: Secondary | ICD-10-CM

## 2018-01-24 MED ORDER — SERTRALINE HCL 25 MG PO TABS: ORAL_TABLET | ORAL | 0 refills | Status: DC

## 2018-01-24 NOTE — Progress Notes (Signed)
Established Patient Office Visit  Subjective:  Patient ID: Gary Soto, male    DOB: [birth year]  Age: 33 y.o. MRN: [id number]  CC:  Chief Complaint  Patient presents with  . Dizziness    HPI Gary Bitterhomas Earll presents for patient developed dizziness described mostly as lightheadedness after a 2-week taper of Zoloft.  He was concomitantly started on Wellbutrin.  Patient denies URI symptoms arrhythmias or an actual spinning sensation.  His lightheadedness did not begin until after he had taken his last dose of Zoloft.  Past Medical History:  Diagnosis Date  . Hypertension     History reviewed. No pertinent surgical history.  Family History  Problem Relation Age of Onset  . Hypertension Mother   . Hyperlipidemia Mother   . Hypertension Maternal Grandmother   . Hyperlipidemia Maternal Grandmother   . Diabetes Maternal Grandmother   . Heart disease Maternal Grandmother     Social History   Socioeconomic History  . Marital status: Single    Spouse name: Not on file  . Number of children: Not on file  . Years of education: Not on file  . Highest education level: Not on file  Occupational History  . Not on file  Social Needs  . Financial resource strain: Not on file  . Food insecurity:    Worry: Not on file    Inability: Not on file  . Transportation needs:    Medical: Not on file    Non-medical: Not on file  Tobacco Use  . Smoking status: Never Smoker  . Smokeless tobacco: Never Used  Substance and Sexual Activity  . Alcohol use: Yes    Comment: 1/5 liquor 2-3x/week  . Drug use: Not Currently    Types: Marijuana  . Sexual activity: Yes  Lifestyle  . Physical activity:    Days per week: Not on file    Minutes per session: Not on file  . Stress: Not on file  Relationships  . Social connections:    Talks on phone: Not on file    Gets together: Not on file    Attends religious service: Not on file    Active member of club or organization: Not on file   Attends meetings of clubs or organizations: Not on file    Relationship status: Not on file  . Intimate partner violence:    Fear of current or ex partner: Not on file    Emotionally abused: Not on file    Physically abused: Not on file    Forced sexual activity: Not on file  Other Topics Concern  . Not on file  Social History Narrative  . Not on file    Outpatient Medications Prior to Visit  Medication Sig Dispense Refill  . hydrOXYzine (ATARAX/VISTARIL) 25 MG tablet Take 1 tablet (25 mg total) by mouth 3 (three) times daily as needed for anxiety. 30 tablet 0  . lisinopril (PRINIVIL,ZESTRIL) 5 MG tablet Take 1 tablet (5 mg total) by mouth daily. 90 tablet 0  . methocarbamol (ROBAXIN) 500 MG tablet Take 1 tablet (500 mg total) by mouth every 8 (eight) hours as needed for muscle spasms. 30 tablet 0  . traZODone (DESYREL) 50 MG tablet Take 1 tablet (50 mg total) by mouth at bedtime as needed for sleep. 30 tablet 0  . sertraline (ZOLOFT) 100 MG tablet Take 100 mg by mouth daily.     No facility-administered medications prior to visit.     No Known Allergies  ROS Review of  Systems  Constitutional: Negative for diaphoresis, fatigue, fever and unexpected weight change.  HENT: Negative.   Eyes: Negative for photophobia and visual disturbance.  Respiratory: Negative.   Cardiovascular: Negative.   Gastrointestinal: Negative.   Neurological: Positive for light-headedness. Negative for dizziness, weakness, numbness and headaches.  Hematological: Does not bruise/bleed easily.      Objective:    Physical Exam  Constitutional: He is oriented to person, place, and time. He appears well-developed and well-nourished. No distress.  HENT:  Head: Normocephalic and atraumatic.  Right Ear: External ear normal.  Left Ear: External ear normal.  Mouth/Throat: Oropharynx is clear and moist. No oropharyngeal exudate.  Eyes: Pupils are equal, round, and reactive to light. Conjunctivae are normal.  Right eye exhibits no discharge. Left eye exhibits no discharge. No scleral icterus.  Neck: Neck supple. No JVD present. No tracheal deviation present. No thyromegaly present.  Cardiovascular: Normal rate, regular rhythm and normal heart sounds.  Pulmonary/Chest: Effort normal and breath sounds normal. No stridor.  Lymphadenopathy:    He has no cervical adenopathy.  Neurological: He is alert and oriented to person, place, and time.  Skin: Skin is warm and dry. He is not diaphoretic.  Psychiatric: He has a normal mood and affect. His behavior is normal.    BP 122/78   Pulse 77   Ht 6' (1.829 m)   Wt 241 lb 4 oz (109.4 kg)   SpO2 97%   BMI 32.72 kg/m  Wt Readings from Last 3 Encounters:  01/24/18 241 lb 4 oz (109.4 kg)  01/03/18 241 lb (109.3 kg)  12/10/17 238 lb 4 oz (108.1 kg)   BP Readings from Last 3 Encounters:  01/24/18 122/78  01/03/18 138/74  12/10/17 128/80   Guideline developer:  UpToDate (see UpToDate for funding source) Date Released: June 2014  Health Maintenance Due  Topic Date Due  . Janet Berlin  07/02/2005    There are no preventive care reminders to display for this patient.  Lab Results  Component Value Date   TSH 2.510 09/14/2017   Lab Results  Component Value Date   WBC 7.4 12/10/2017   HGB 14.9 12/10/2017   HCT 44.9 12/10/2017   MCV 87.2 12/10/2017   PLT 249.0 12/10/2017   Lab Results  Component Value Date   NA 137 12/10/2017   K 3.8 12/10/2017   CO2 23 12/10/2017   GLUCOSE 88 12/10/2017   BUN 21 12/10/2017   CREATININE 0.99 12/10/2017   BILITOT 0.4 12/10/2017   ALKPHOS 51 12/10/2017   AST 21 12/10/2017   ALT 29 12/10/2017   PROT 7.3 12/10/2017   ALBUMIN 4.4 12/10/2017   CALCIUM 9.4 12/10/2017   ANIONGAP 11 09/23/2017   GFR 113.07 12/10/2017   Lab Results  Component Value Date   CHOL 162 12/10/2017   Lab Results  Component Value Date   HDL 58.60 12/10/2017   Lab Results  Component Value Date   LDLCALC 88 12/10/2017    Lab Results  Component Value Date   TRIG 76.0 12/10/2017   Lab Results  Component Value Date   CHOLHDL 3 12/10/2017   Lab Results  Component Value Date   HGBA1C 5.9 (H) 09/14/2017      Assessment & Plan:   Problem List Items Addressed This Visit      Other   Withdrawal complaint - Primary   Relevant Medications   sertraline (ZOLOFT) 25 MG tablet      Meds ordered this encounter  Medications  . sertraline (  ZOLOFT) 25 MG tablet    Sig: Take one each day for 1 week, then 1/2 daily for a week, then 1/2 every other day for a week and then stop.    Dispense:  15 tablet    Refill:  0   Wrote for a slower Zoloft taper.  He will follow-up with me if the dizziness not resolves.  Follow-up: Return in about 3 months (around 04/25/2018), or if symptoms worsen or fail to improve.

## 2018-02-20 ENCOUNTER — Telehealth: Payer: Self-pay | Admitting: Family Medicine

## 2018-02-20 DIAGNOSIS — F32A Depression, unspecified: Secondary | ICD-10-CM

## 2018-02-20 DIAGNOSIS — F321 Major depressive disorder, single episode, moderate: Secondary | ICD-10-CM

## 2018-02-20 DIAGNOSIS — F329 Major depressive disorder, single episode, unspecified: Secondary | ICD-10-CM

## 2018-02-20 MED ORDER — BUPROPION HCL 75 MG PO TABS: ORAL_TABLET | ORAL | 0 refills | Status: DC

## 2018-02-20 NOTE — Telephone Encounter (Signed)
I called and spoke with patient. He is aware Rx sent in & he will make an appointment for a one month follow up.

## 2018-02-20 NOTE — Telephone Encounter (Signed)
Sent in rx for wellbutrin. RTC to see me within a month of starting medication.

## 2018-02-20 NOTE — Telephone Encounter (Signed)
Copied from CRM [id number]. Topic: Quick Communication - See Telephone Encounter >> Feb 20, 2018  1:09 PM Terisa Starr wrote: CRM for notification. See Telephone encounter for: 02/20/18.  Patient states at his visit last with Dr Doreene Burke he was going to put him on Wellbutrin 150 mg. He would like Dr Doreene Burke to go ahead and call that in for him  [id number] - [place], [place] - [address] AT [address] [facility] [address] [place] [id number]

## 2018-03-08 ENCOUNTER — Other Ambulatory Visit: Payer: Self-pay | Admitting: Family Medicine

## 2018-03-08 DIAGNOSIS — I10 Essential (primary) hypertension: Secondary | ICD-10-CM

## 2018-04-28 ENCOUNTER — Ambulatory Visit: Payer: Self-pay | Admitting: Family Medicine

## 2018-09-19 ENCOUNTER — Other Ambulatory Visit: Payer: Self-pay

## 2018-09-19 DIAGNOSIS — I10 Essential (primary) hypertension: Secondary | ICD-10-CM

## 2018-09-19 MED ORDER — LISINOPRIL 5 MG PO TABS: ORAL_TABLET | ORAL | 0 refills | Status: DC

## 2018-09-24 ENCOUNTER — Ambulatory Visit (INDEPENDENT_AMBULATORY_CARE_PROVIDER_SITE_OTHER): Payer: Managed Care, Other (non HMO) | Admitting: Family Medicine

## 2018-09-24 ENCOUNTER — Encounter: Payer: Self-pay | Admitting: Family Medicine

## 2018-09-24 ENCOUNTER — Other Ambulatory Visit: Payer: Self-pay

## 2018-09-24 VITALS — Ht 72.0 in

## 2018-09-24 DIAGNOSIS — R059 Cough, unspecified: Secondary | ICD-10-CM | POA: Insufficient documentation

## 2018-09-24 DIAGNOSIS — J301 Allergic rhinitis due to pollen: Secondary | ICD-10-CM

## 2018-09-24 DIAGNOSIS — R05 Cough: Secondary | ICD-10-CM

## 2018-09-24 MED ORDER — FLUTICASONE PROPIONATE 50 MCG/ACT NA SUSP: 2.0000 | Freq: Every day | NASAL | 6 refills | Status: DC

## 2018-09-24 MED ORDER — PREDNISONE 10 MG PO TABS: 10.0000 mg | ORAL_TABLET | Freq: Two times a day (BID) | ORAL | 0 refills | Status: AC

## 2018-09-24 NOTE — Progress Notes (Signed)
Established Patient Office Visit  Subjective:  Patient ID: Gary Soto, male    DOB: July 07, 1985  Age: 33 y.o. MRN: [id number]  CC:  Chief Complaint  Patient presents with  . Cough    x 2 weeks    HPI Gary Soto presents for evaluation and treatment of a 2-week history of cough.  Patient denies fevers chills wheezing history of reactive airway disease or sputum production.  He sneezes occasionally.  His eyes do itch.  He denies myalgias arthralgias or tobacco use.  He has noticed some postnasal drip.  He has been taking an over-the-counter allergy pill with some relief.  Past Medical History:  Diagnosis Date  . Hypertension     History reviewed. No pertinent surgical history.  Family History  Problem Relation Age of Onset  . Hypertension Mother   . Hyperlipidemia Mother   . Hypertension Maternal Grandmother   . Hyperlipidemia Maternal Grandmother   . Diabetes Maternal Grandmother   . Heart disease Maternal Grandmother     Social History   Socioeconomic History  . Marital status: Single    Spouse name: Not on file  . Number of children: Not on file  . Years of education: Not on file  . Highest education level: Not on file  Occupational History  . Not on file  Social Needs  . Financial resource strain: Not on file  . Food insecurity    Worry: Not on file    Inability: Not on file  . Transportation needs    Medical: Not on file    Non-medical: Not on file  Tobacco Use  . Smoking status: Never Smoker  . Smokeless tobacco: Never Used  Substance and Sexual Activity  . Alcohol use: Yes    Comment: 1/5 liquor 2-3x/week  . Drug use: Not Currently    Types: Marijuana  . Sexual activity: Yes  Lifestyle  . Physical activity    Days per week: Not on file    Minutes per session: Not on file  . Stress: Not on file  Relationships  . Social Herbalist on phone: Not on file    Gets together: Not on file    Attends religious service: Not on file   Active member of club or organization: Not on file    Attends meetings of clubs or organizations: Not on file    Relationship status: Not on file  . Intimate partner violence    Fear of current or ex partner: Not on file    Emotionally abused: Not on file    Physically abused: Not on file    Forced sexual activity: Not on file  Other Topics Concern  . Not on file  Social History Narrative  . Not on file    Outpatient Medications Prior to Visit  Medication Sig Dispense Refill  . buPROPion (WELLBUTRIN) 75 MG tablet Take one pill daily for one week and then increase to 2 pills daily. Return to clinic in one month. 60 tablet 0  . hydrOXYzine (ATARAX/VISTARIL) 25 MG tablet Take 1 tablet (25 mg total) by mouth 3 (three) times daily as needed for anxiety. 30 tablet 0  . lisinopril (ZESTRIL) 5 MG tablet TAKE 1 TABLET(5 MG) BY MOUTH DAILY 90 tablet 0  . methocarbamol (ROBAXIN) 500 MG tablet Take 1 tablet (500 mg total) by mouth every 8 (eight) hours as needed for muscle spasms. 30 tablet 0  . sertraline (ZOLOFT) 25 MG tablet Take one each day for  1 week, then 1/2 daily for a week, then 1/2 every other day for a week and then stop. 15 tablet 0  . traZODone (DESYREL) 50 MG tablet Take 1 tablet (50 mg total) by mouth at bedtime as needed for sleep. 30 tablet 0   No facility-administered medications prior to visit.     No Known Allergies  ROS Review of Systems  Constitutional: Negative for chills, diaphoresis, fatigue, fever and unexpected weight change.  HENT: Positive for postnasal drip. Negative for congestion, sinus pressure, sinus pain and sore throat.   Eyes: Positive for itching.  Respiratory: Positive for cough. Negative for chest tightness, shortness of breath and wheezing.   Cardiovascular: Negative.   Gastrointestinal: Negative.   Musculoskeletal: Negative for arthralgias and myalgias.  Skin: Negative for pallor and rash.  Neurological: Negative for headaches.  Hematological:  Negative.       Objective:    Physical Exam  Constitutional: He is oriented to person, place, and time. He appears well-developed and well-nourished. No distress.  HENT:  Head: Normocephalic and atraumatic.  Right Ear: External ear normal.  Left Ear: External ear normal.  Eyes: Conjunctivae are normal. Right eye exhibits no discharge. Left eye exhibits no discharge. No scleral icterus.  Pulmonary/Chest: Effort normal. No stridor.  Neurological: He is alert and oriented to person, place, and time.  Skin: He is not diaphoretic.  Psychiatric: He has a normal mood and affect. His behavior is normal.    Ht 6' (1.829 m)   BMI 32.72 kg/m  Wt Readings from Last 3 Encounters:  01/24/18 241 lb 4 oz (109.4 kg)  01/03/18 241 lb (109.3 kg)  12/10/17 238 lb 4 oz (108.1 kg)   BP Readings from Last 3 Encounters:  01/24/18 122/78  01/03/18 138/74  12/10/17 128/80   Guideline developer:  UpToDate (see UpToDate for funding source) Date Released: June 2014  Health Maintenance Due  Topic Date Due  . Janet Berlin  07/02/2005  . INFLUENZA VACCINE  08/16/2018    There are no preventive care reminders to display for this patient.  Lab Results  Component Value Date   TSH 2.510 09/14/2017   Lab Results  Component Value Date   WBC 7.4 12/10/2017   HGB 14.9 12/10/2017   HCT 44.9 12/10/2017   MCV 87.2 12/10/2017   PLT 249.0 12/10/2017   Lab Results  Component Value Date   NA 137 12/10/2017   K 3.8 12/10/2017   CO2 23 12/10/2017   GLUCOSE 88 12/10/2017   BUN 21 12/10/2017   CREATININE 0.99 12/10/2017   BILITOT 0.4 12/10/2017   ALKPHOS 51 12/10/2017   AST 21 12/10/2017   ALT 29 12/10/2017   PROT 7.3 12/10/2017   ALBUMIN 4.4 12/10/2017   CALCIUM 9.4 12/10/2017   ANIONGAP 11 09/23/2017   GFR 113.07 12/10/2017   Lab Results  Component Value Date   CHOL 162 12/10/2017   Lab Results  Component Value Date   HDL 58.60 12/10/2017   Lab Results  Component Value Date    LDLCALC 88 12/10/2017   Lab Results  Component Value Date   TRIG 76.0 12/10/2017   Lab Results  Component Value Date   CHOLHDL 3 12/10/2017   Lab Results  Component Value Date   HGBA1C 5.9 (H) 09/14/2017      Assessment & Plan:   Problem List Items Addressed This Visit      Respiratory   Seasonal allergic rhinitis due to pollen - Primary   Relevant Medications  fluticasone (FLONASE) 50 MCG/ACT nasal spray   predniSONE (DELTASONE) 10 MG tablet     Other   Cough      Meds ordered this encounter  Medications  . fluticasone (FLONASE) 50 MCG/ACT nasal spray    Sig: Place 2 sprays into both nostrils daily.    Dispense:  16 g    Refill:  6  . predniSONE (DELTASONE) 10 MG tablet    Sig: Take 1 tablet (10 mg total) by mouth 2 (two) times daily with a meal for 7 days.    Dispense:  14 tablet    Refill:  0    Follow-up: Return in about 2 weeks (around 10/08/2018), or if symptoms worsen or fail to improve.    Virtual Visit via Video Note  I connected with Gary Soto on 09/24/18 at  9:00 AM EDT by a video enabled telemedicine application and verified that I am speaking with the correct person using two identifiers.  Location: Patient: home Provider:    I discussed the limitations of evaluation and management by telemedicine and the availability of in person appointments. The patient expressed understanding and agreed to proceed.  History of Present Illness:    Observations/Objective:   Assessment and Plan:   Follow Up Instructions:    I discussed the assessment and treatment plan with the patient. The patient was provided an opportunity to ask questions and all were answered. The patient agreed with the plan and demonstrated an understanding of the instructions.   The patient was advised to call back or seek an in-person evaluation if the symptoms worsen or if the condition fails to improve as anticipated.  I provided 15.  minutes of non-face-to-face  time during this encounter.   Mliss SaxWilliam Alfred , MD

## 2018-12-22 ENCOUNTER — Other Ambulatory Visit: Payer: Self-pay | Admitting: Family Medicine

## 2018-12-22 DIAGNOSIS — I10 Essential (primary) hypertension: Secondary | ICD-10-CM

## 2018-12-22 NOTE — Telephone Encounter (Signed)
Attempted to reach pt, no answer. Left detailed vm (per DPR) to schedule an OV for his BP

## 2019-03-24 ENCOUNTER — Other Ambulatory Visit: Payer: Self-pay | Admitting: Family Medicine

## 2019-03-24 DIAGNOSIS — I10 Essential (primary) hypertension: Secondary | ICD-10-CM

## 2019-10-14 ENCOUNTER — Other Ambulatory Visit: Payer: Self-pay | Admitting: Family Medicine

## 2019-10-14 DIAGNOSIS — J301 Allergic rhinitis due to pollen: Secondary | ICD-10-CM

## 2019-11-05 IMAGING — CR DG CHEST 2V
2 series · 2 of 2 positions shown · non-contrast
Comparison: 06/02/2017

CLINICAL DATA: Shortness of breath

EXAM:
CHEST - 2 VIEW

[w chest pa]
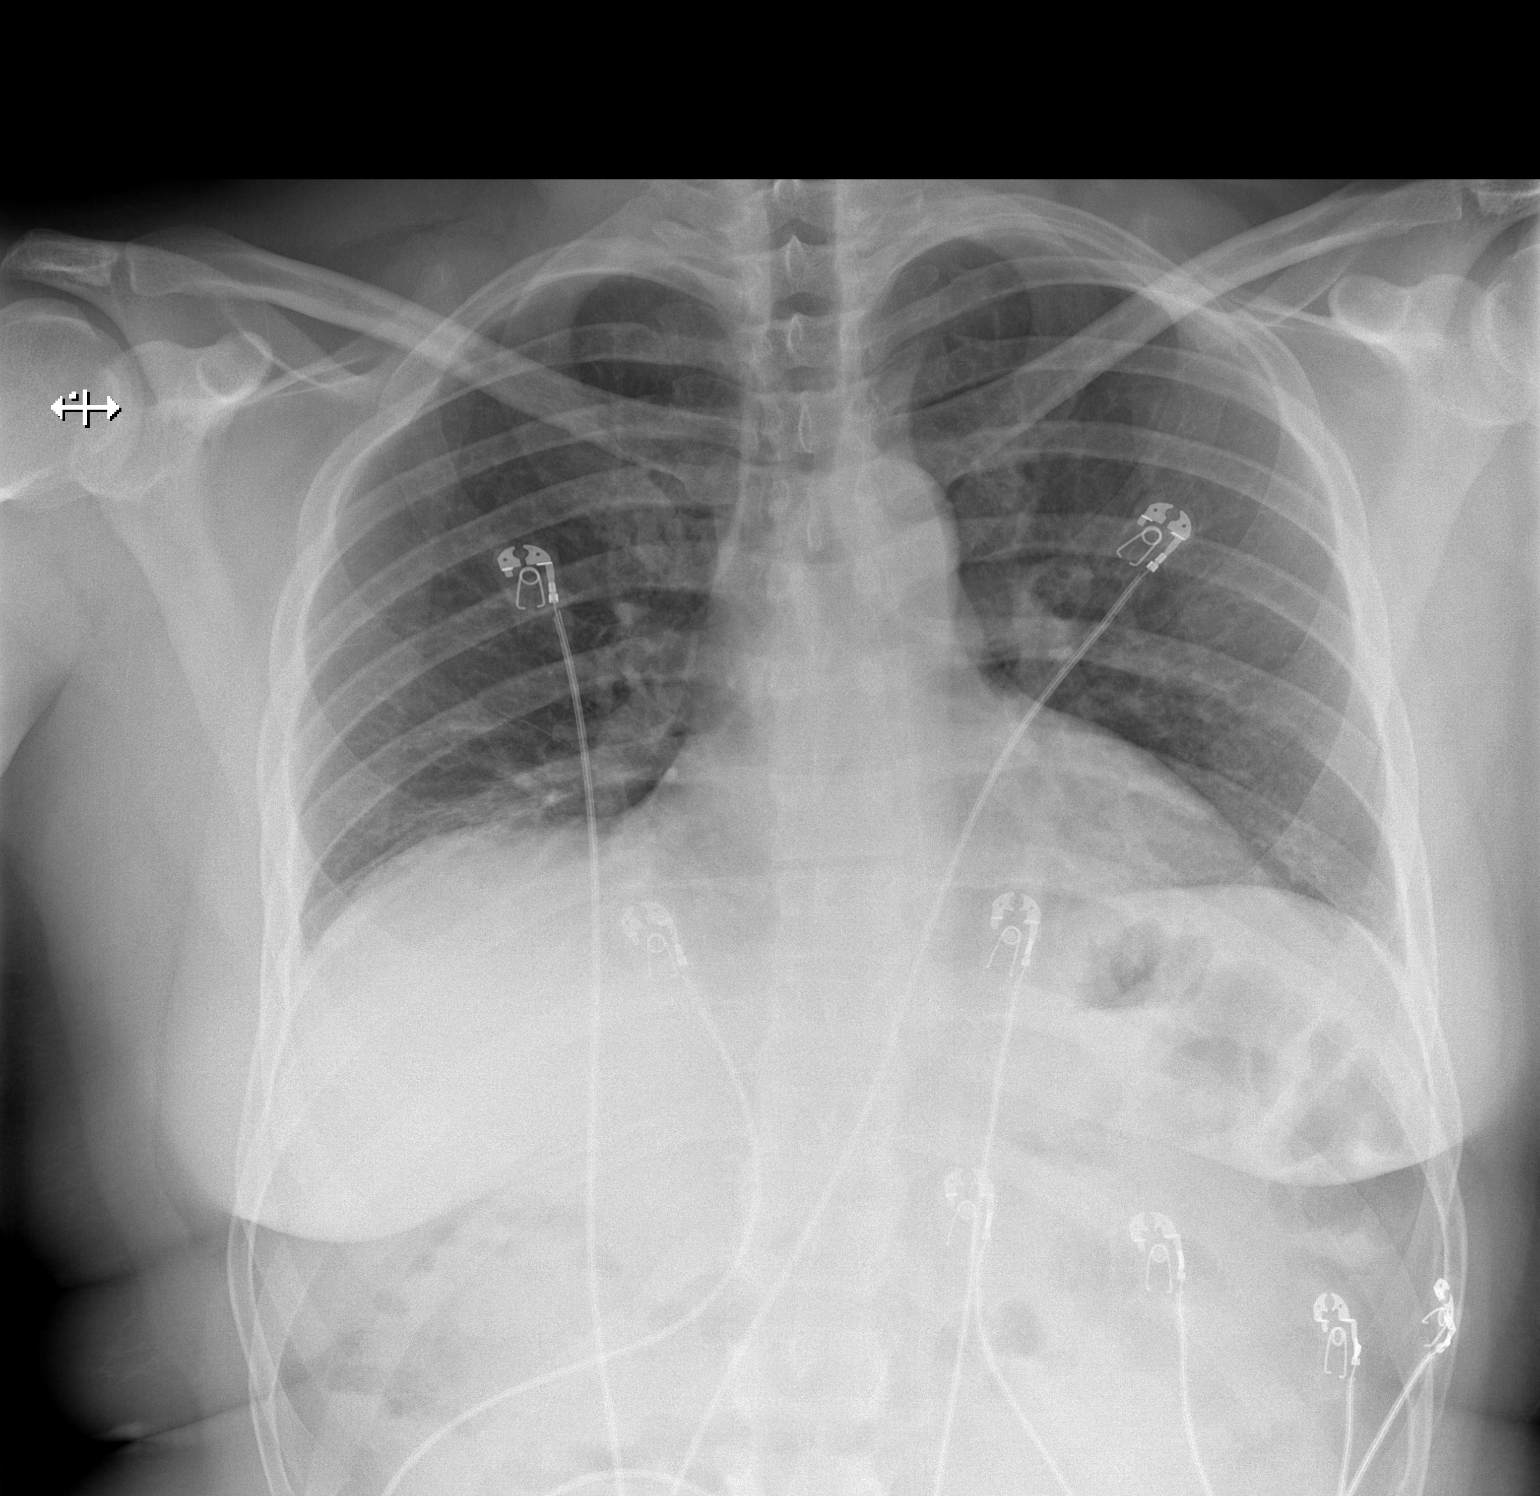

[w chest lat]
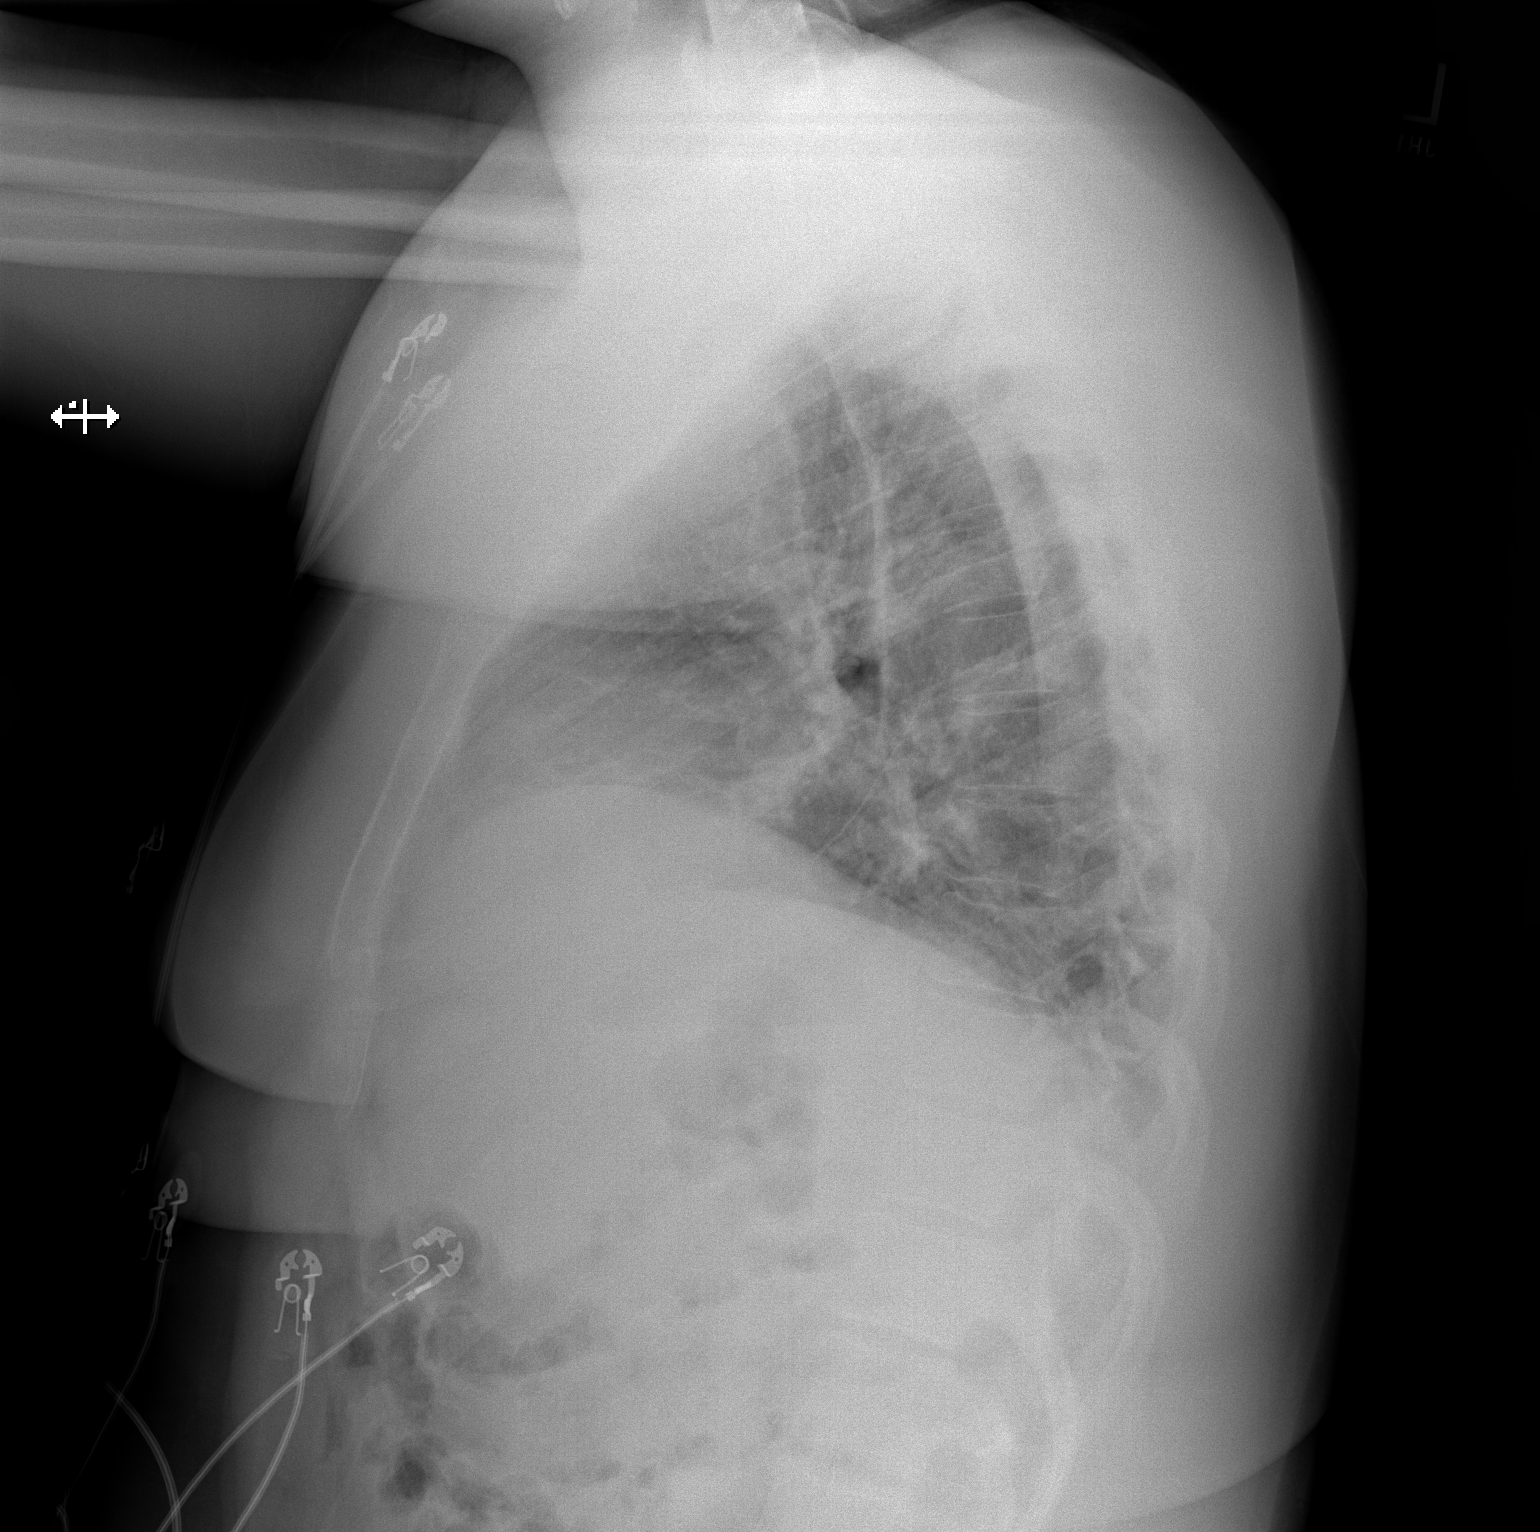

[2 of 2 positions shown; findings below may reference images not displayed]

FINDINGS: Low lung volumes with subsegmental atelectasis at the right greater
than left lung base. Borderline cardiomegaly augmented by low lung
volume. No pneumothorax.
IMPRESSION: No active cardiopulmonary disease. Low lung volumes with bibasilar
atelectasis. Borderline cardiomegaly.

## 2019-11-05 IMAGING — CT CT ANGIO CHEST
4 of 9 series · 18 of 46 positions shown · IV contrast (ISOVUE)
Comparison: None.

CLINICAL DATA: Upper back pain

EXAM:
CT ANGIOGRAPHY CHEST WITH CONTRAST
TECHNIQUE: Multidetector CT imaging of the chest was performed using the
standard protocol during bolus administration of intravenous
contrast. Multiplanar CT image reconstructions and MIPs were
obtained to evaluate the vascular anatomy.
CONTRAST:  100mL [id number] IOPAMIDOL ([id number]) INJECTION 76%

[Series 2: axial pre · axial · non-contrast · 0.84mm/px · z∈[+1319,+1459]mm · 3 of 58 slices shown]
[im 15/58  lung]
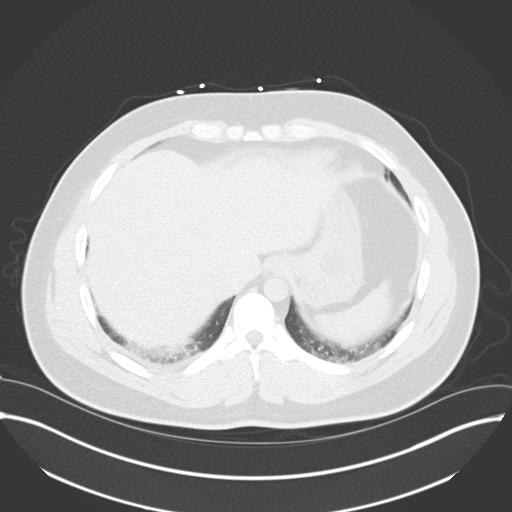
[im 29/58  lung]
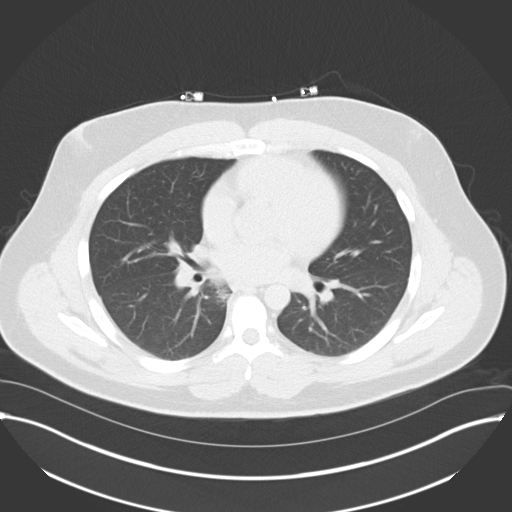
[im 43/58  lung]
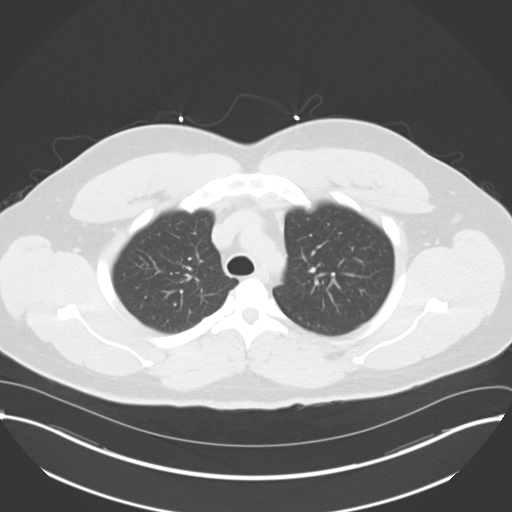

[Series 5: axial arterial · axial · arterial · 0.86mm/px · z∈[+1278,+1488]mm · 6 of 98 slices shown]
[im 14/98  lung]
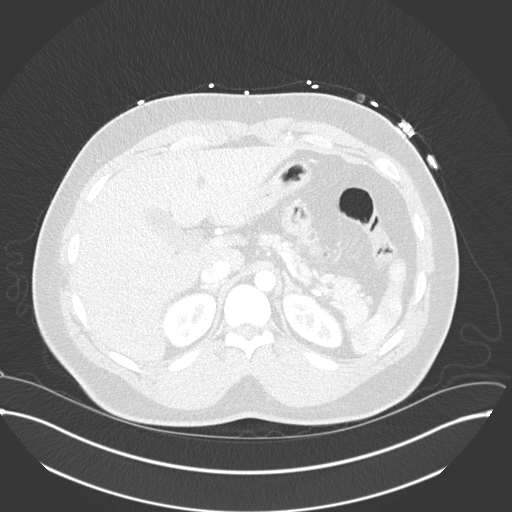
[im 28/98  soft-tissue]
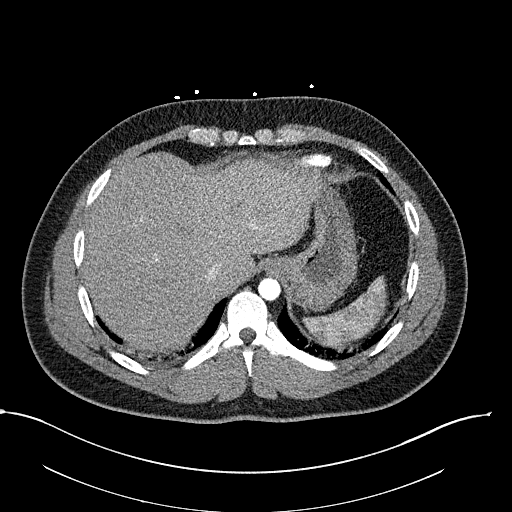
[im 42/98  lung]
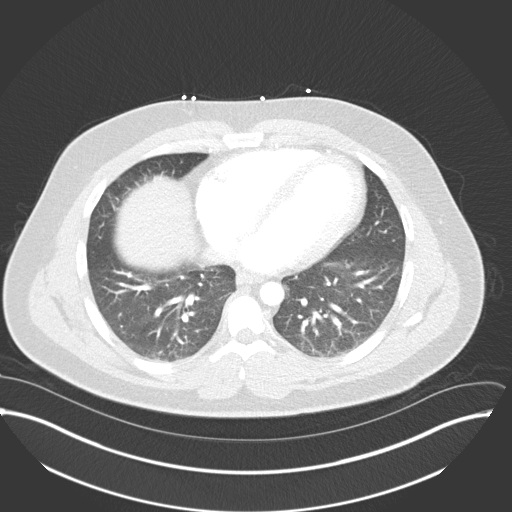
[im 56/98  soft-tissue]
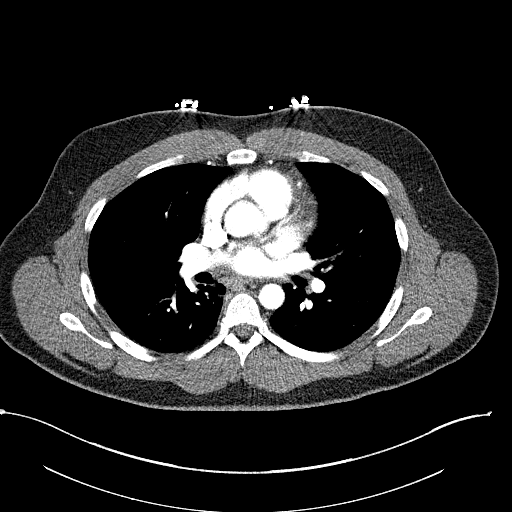
[im 70/98  lung]
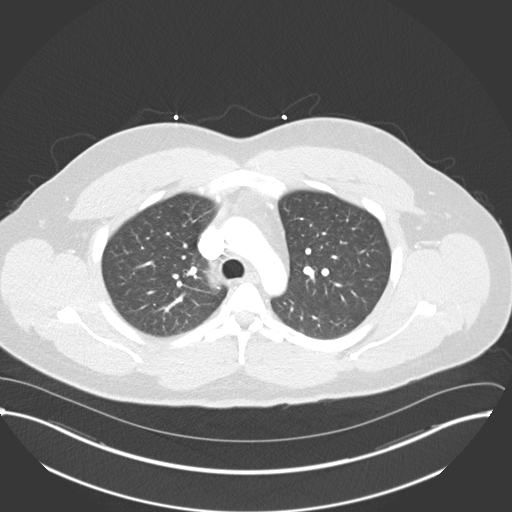
[im 84/98  soft-tissue]
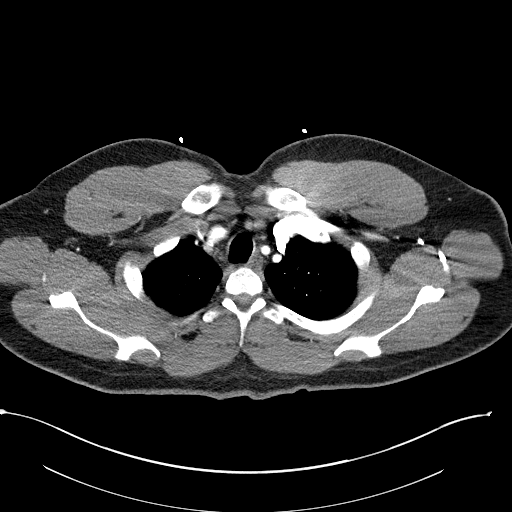

[Series 6: lung pre · axial · non-contrast · 0.62mm/px · z∈[+1320,+1454]mm · 6 of 121 slices shown]
[im 14/121  soft-tissue]
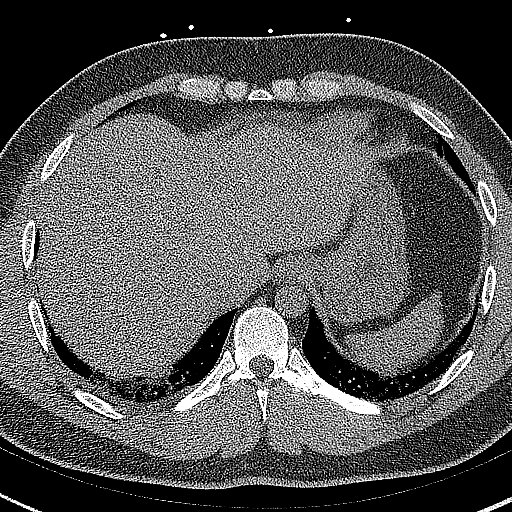
[im 27/121  soft-tissue]
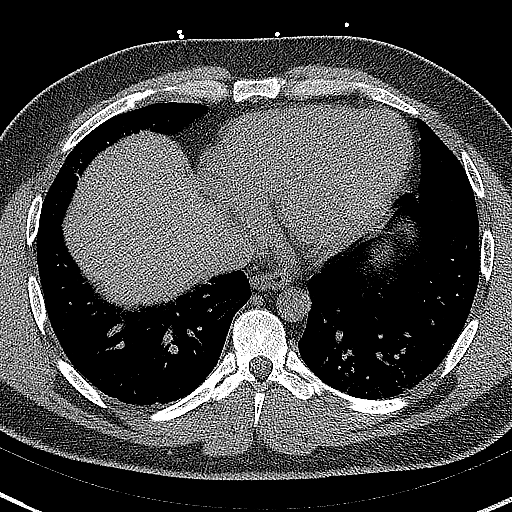
[im 41/121  soft-tissue]
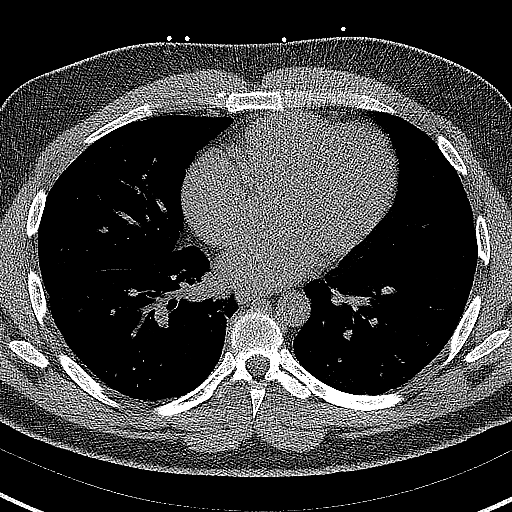
[im 54/121  soft-tissue]
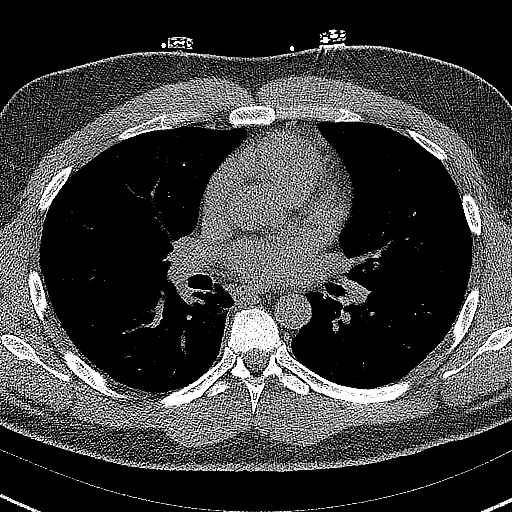
[im 67/121  soft-tissue]
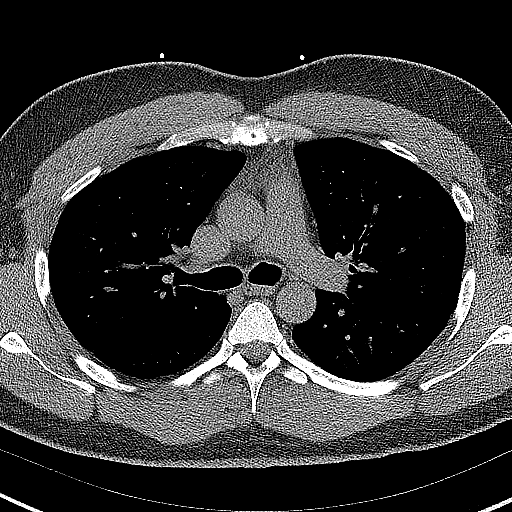
[im 81/121  soft-tissue]
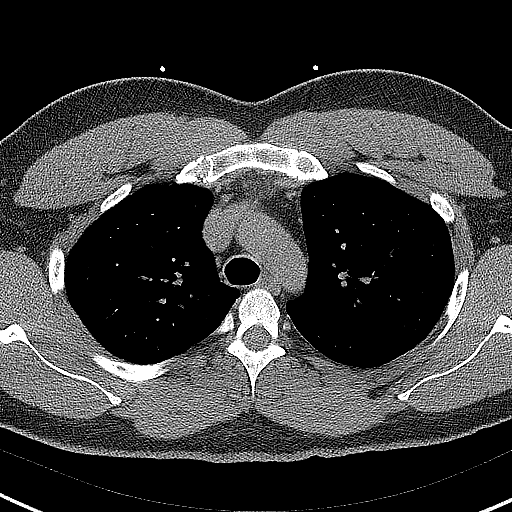

[Series 9: coronals · coronal · 0.61mm/px · 3 of 145 slices shown]
[im 37/145  soft-tissue]
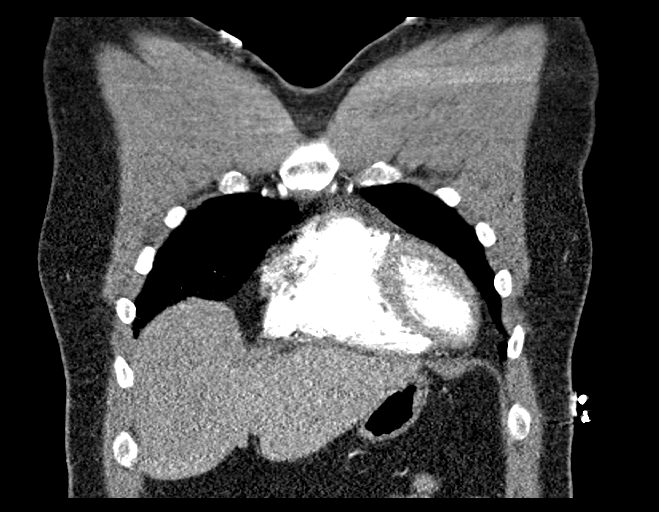
[im 73/145  soft-tissue]
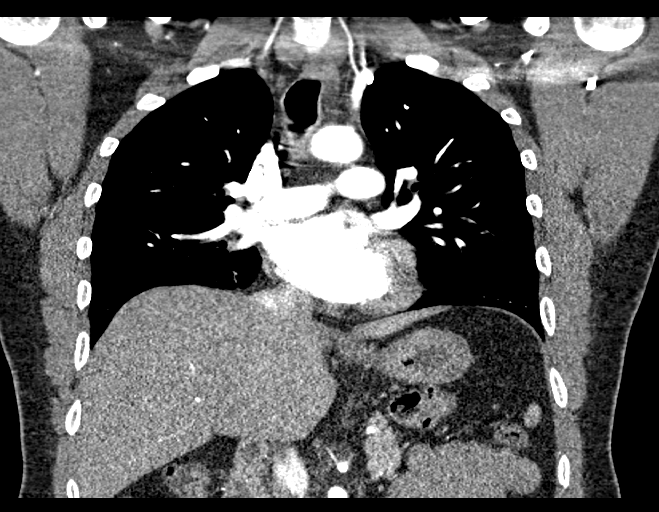
[im 109/145  soft-tissue]
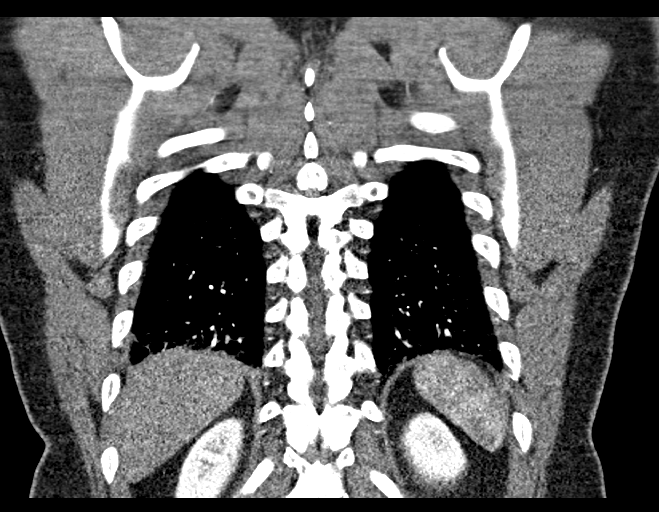

[18 of 46 positions shown; findings below may reference images not displayed]

FINDINGS: Cardiovascular: There is no evidence of aortic dissection or
intramural hematoma. The aorta is nonaneurysmal. No obvious
pulmonary thromboembolism. Great vessels are patent.

Mediastinum/Nodes: No abnormal mediastinal adenopathy. Residual
thymic tissue is noted. Visualized thyroid is unremarkable. No
pericardial effusion.

Lungs/Pleura: No pneumothorax. No pleural effusion. Subsegmental
atelectasis at the lung bases.

Upper Abdomen: No acute abnormality.

Musculoskeletal: No vertebral compression deformity.

Review of the MIP images confirms the above findings.
IMPRESSION: No evidence of aortic dissection. No acute vascular pathology. No
active cardiopulmonary disease.

## 2020-01-20 ENCOUNTER — Other Ambulatory Visit: Payer: Managed Care, Other (non HMO)

## 2020-01-22 ENCOUNTER — Encounter: Payer: Self-pay | Admitting: Internal Medicine

## 2020-01-22 ENCOUNTER — Ambulatory Visit: Admit: 2020-01-22 | Discharge status: Against Medical Advice | Payer: Managed Care, Other (non HMO)

## 2020-01-22 ENCOUNTER — Telehealth (INDEPENDENT_AMBULATORY_CARE_PROVIDER_SITE_OTHER): Payer: Managed Care, Other (non HMO) | Admitting: Internal Medicine

## 2020-01-22 VITALS — Ht 72.0 in | Wt 245.0 lb

## 2020-01-22 DIAGNOSIS — U071 COVID-19: Secondary | ICD-10-CM | POA: Diagnosis not present

## 2020-01-22 MED ORDER — ONDANSETRON HCL 8 MG PO TABS: 8.0000 mg | ORAL_TABLET | Freq: Three times a day (TID) | ORAL | 0 refills | Status: DC | PRN

## 2020-01-22 MED ORDER — GUAIFENESIN-CODEINE 100-10 MG/5ML PO SOLN: 5.0000 mL | Freq: Two times a day (BID) | ORAL | 0 refills | Status: DC | PRN

## 2020-01-22 MED ORDER — ALBUTEROL SULFATE HFA 108 (90 BASE) MCG/ACT IN AERS: 2.0000 | INHALATION_SPRAY | Freq: Four times a day (QID) | RESPIRATORY_TRACT | 0 refills | Status: DC | PRN

## 2020-01-22 MED ORDER — PULSE OXIMETER MISC: 0 refills | Status: DC

## 2020-01-22 NOTE — Progress Notes (Signed)
Subjective:    Patient ID: General Gary Soto, male    DOB: [birth year], 35 y.o.   MRN: [id number]  DOS:  01/22/2020 Type of visit - description: Virtual Visit via Video Note  I connected with the above patient  by a video enabled telemedicine application and verified that I am speaking with the correct person using two identifiers.   THIS ENCOUNTER IS A VIRTUAL VISIT DUE TO COVID-19 - PATIENT WAS NOT SEEN IN THE OFFICE. PATIENT HAS CONSENTED TO VIRTUAL VISIT / TELEMEDICINE VISIT   Location of patient: home  Location of provider: office  Persons participating in the virtual visit: patient, provider   I discussed the limitations of evaluation and management by telemedicine and the availability of in person appointments. The patient expressed understanding and agreed to proceed.  Acute Symptoms a started a week ago, he tested positive for COVID 01/19/2020. Symptoms are cough which is dry, intense at times, has developed anterior chest pain with cough. He also has some nausea and vomiting, symptoms worsening with cough. Decreased appetite but reports he is drinking plenty of fluids and urine output seems normal. Has noted chest congestion, wheezing?.  Has not checked his temperature Chest pain only with cough. Had some diarrhea, no blood in the stools. No headache, no rash.    Review of Systems See above   Past Medical History:  Diagnosis Date  . Hypertension     No past surgical history on file.  Allergies as of 01/22/2020   No Known Allergies     Medication List       Accurate as of January 22, 2020  9:21 AM. If you have any questions, ask your nurse or doctor.        buPROPion 75 MG tablet Commonly known as: WELLBUTRIN Take one pill daily for one week and then increase to 2 pills daily. Return to clinic in one month.   fluticasone 50 MCG/ACT nasal spray Commonly known as: FLONASE Place 2 sprays into both nostrils daily.   hydrOXYzine 25 MG tablet Commonly known  as: ATARAX/VISTARIL Take 1 tablet (25 mg total) by mouth 3 (three) times daily as needed for anxiety.   lisinopril 5 MG tablet Commonly known as: ZESTRIL TAKE 1 TABLET(5 MG) BY MOUTH DAILY   methocarbamol 500 MG tablet Commonly known as: ROBAXIN Take 1 tablet (500 mg total) by mouth every 8 (eight) hours as needed for muscle spasms.   sertraline 25 MG tablet Commonly known as: ZOLOFT Take one each day for 1 week, then 1/2 daily for a week, then 1/2 every other day for a week and then stop.   traZODone 50 MG tablet Commonly known as: DESYREL Take 1 tablet (50 mg total) by mouth at bedtime as needed for sleep.          Objective:   Physical Exam Ht 6' (1.829 m)   Wt 245 lb (111.1 kg)   BMI 33.23 kg/m  This is a virtual video visit, alert oriented x3, frequent cough noted.  Speaking in complete sentences    Assessment    35 year old male, Per chart review he has a history of high blood pressure and mood disorder, currently taking no medicines.  Presents with:  COVID-19: Symptoms started a week ago,+ Testing 3 days ago, no vital signs available.  No history of asthma but at some point was prescribed inhalers (likely has reactive airway disease). Recommend the following: Continue with Mucinex, TheraFlu, ibuprofen as needed Drink plenty of fluids Send a prescription  for codeine, Zofran, albuterol Refer for monoclonal antibody, he still qualifies per algorithm.  He understood that supplies are very limited. Because he is unvaccinated he is @ risk to get much worse, recommend to monitor symptoms carefully, ER if worse, see message.   I discussed the assessment and treatment plan with the patient. The patient was provided an opportunity to ask questions and all were answered. The patient agreed with the plan and demonstrated an understanding of the instructions.   The patient was advised to call back or seek an in-person evaluation if the symptoms worsen or if the condition  fails to improve as anticipated.

## 2020-01-22 NOTE — Progress Notes (Signed)
Pre visit review using our clinic review tool, if applicable. No additional management support is needed unless otherwise documented below in the visit note. 

## 2020-01-26 ENCOUNTER — Encounter: Payer: Self-pay | Admitting: Internal Medicine

## 2020-01-26 ENCOUNTER — Telehealth (INDEPENDENT_AMBULATORY_CARE_PROVIDER_SITE_OTHER): Payer: Managed Care, Other (non HMO) | Admitting: Internal Medicine

## 2020-01-26 ENCOUNTER — Other Ambulatory Visit: Payer: Self-pay

## 2020-01-26 VITALS — Ht 72.0 in | Wt 245.0 lb

## 2020-01-26 DIAGNOSIS — U071 COVID-19: Secondary | ICD-10-CM | POA: Diagnosis not present

## 2020-01-26 NOTE — Progress Notes (Signed)
Subjective:    Patient ID: Gary Soto, male    DOB: 06-07-1985, 35 y.o.   MRN: [id number]  DOS:  01/26/2020 Type of visit - description: Virtual Visit via Video Note  I connected with the above patient  by a video enabled telemedicine application and verified that I am speaking with the correct person using two identifiers.   THIS ENCOUNTER IS A VIRTUAL VISIT DUE TO COVID-19 - PATIENT WAS NOT SEEN IN THE OFFICE. PATIENT HAS CONSENTED TO VIRTUAL VISIT / TELEMEDICINE VISIT   Location of patient: home  Location of provider: office  Persons participating in the virtual visit: patient, provider   I discussed the limitations of evaluation and management by telemedicine and the availability of in person appointments. The patient expressed understanding and agreed to proceed.  Acute Symptoms a started 01/16/2020, had a positive COVID test 01/19/2020, was seen virtually here 3 days later,was rec conservative treatment. Overall feels better but he is concerned about going back to work: Still short of breath when he walks. Cough has decreased, chest pain has decreased. No nausea, vomiting, diarrhea. Appetite is coming back. Had fever 2 days ago.   Review of Systems See above   Past Medical History:  Diagnosis Date   Hypertension     No past surgical history on file.  Allergies as of 01/26/2020   No Known Allergies     Medication List       Accurate as of January 26, 2020  3:49 PM. If you have any questions, ask your nurse or doctor.        albuterol 108 (90 Base) MCG/ACT inhaler Commonly known as: VENTOLIN HFA Inhale 2 puffs into the lungs every 6 (six) hours as needed for wheezing or shortness of breath.   guaiFENesin-codeine 100-10 MG/5ML syrup Take 5 mLs by mouth 2 (two) times daily as needed for cough.   ondansetron 8 MG tablet Commonly known as: Zofran Take 1 tablet (8 mg total) by mouth every 8 (eight) hours as needed for nausea or vomiting.   Pulse Oximeter  Misc Use often to check O2 for COVID 19          Objective:   Physical Exam Ht 6' (1.829 m)    Wt 245 lb (111.1 kg)    BMI 33.23 kg/m  This is a virtual video visit the patient is alert oriented x3, no vital signs available.  Compared to the last visit he seems better, has no cough during the visit.    Assessment     35 year old male, Per chart review he has a history of high blood pressure and mood disorder, currently taking no medicines.  Presents with:  COVID-19: sxs started 10 days ago, overall slightly better but continued to have some cough and dyspnea on exertion. No vital signs available, no oximetry available. His main concern is a work note, he is a Production designer, theatre/television/film at [employer] and he does long walks at [employer]. Had subjective fever up to 2 days ago. I recommend a chest x-ray to r/o [facility] but the patient lives in [place] and is very taxing for him to come this way. Plan: Continue conservative treatment, call me in 2 days, if he is not better were going to either see him in person here or send him to one of the COVID clinics. Will extend his work note, okay to return 02/01/2020 unless he continue with symptoms or high fever    I discussed the assessment and treatment plan with  the patient. The patient was provided an opportunity to ask questions and all were answered. The patient agreed with the plan and demonstrated an understanding of the instructions.   The patient was advised to call back or seek an in-person evaluation if the symptoms worsen or if the condition fails to improve as anticipated.

## 2020-01-26 NOTE — Progress Notes (Unsigned)
Pre visit review using our clinic review tool, if applicable. No additional management support is needed unless otherwise documented below in the visit note. 

## 2020-02-11 ENCOUNTER — Other Ambulatory Visit: Payer: Self-pay | Admitting: Internal Medicine

## 2020-08-25 ENCOUNTER — Encounter: Payer: Self-pay | Admitting: Family Medicine

## 2020-08-25 ENCOUNTER — Ambulatory Visit (INDEPENDENT_AMBULATORY_CARE_PROVIDER_SITE_OTHER): Payer: Managed Care, Other (non HMO) | Admitting: Family Medicine

## 2020-08-25 ENCOUNTER — Other Ambulatory Visit: Payer: Self-pay

## 2020-08-25 VITALS — BP 144/90 | HR 74 | Temp 97.3°F | Ht 72.0 in | Wt 249.2 lb

## 2020-08-25 DIAGNOSIS — Z0000 Encounter for general adult medical examination without abnormal findings: Secondary | ICD-10-CM | POA: Diagnosis not present

## 2020-08-25 DIAGNOSIS — I10 Essential (primary) hypertension: Secondary | ICD-10-CM

## 2020-08-25 DIAGNOSIS — J310 Chronic rhinitis: Secondary | ICD-10-CM | POA: Diagnosis not present

## 2020-08-25 DIAGNOSIS — R0683 Snoring: Secondary | ICD-10-CM | POA: Diagnosis not present

## 2020-08-25 DIAGNOSIS — J45909 Unspecified asthma, uncomplicated: Secondary | ICD-10-CM | POA: Insufficient documentation

## 2020-08-25 DIAGNOSIS — J4540 Moderate persistent asthma, uncomplicated: Secondary | ICD-10-CM

## 2020-08-25 DIAGNOSIS — R0981 Nasal congestion: Secondary | ICD-10-CM | POA: Diagnosis not present

## 2020-08-25 DIAGNOSIS — J351 Hypertrophy of tonsils: Secondary | ICD-10-CM

## 2020-08-25 LAB — COMPREHENSIVE METABOLIC PANEL
ALT: 37 U/L (ref 0–53)
AST: 25 U/L (ref 0–37)
Albumin: 4.5 g/dL (ref 3.5–5.2)
Alkaline Phosphatase: 64 U/L (ref 39–117)
BUN: 21 mg/dL (ref 6–23)
CO2: 27 mEq/L (ref 19–32)
Calcium: 9.4 mg/dL (ref 8.4–10.5)
Chloride: 103 mEq/L (ref 96–112)
Creatinine, Ser: 1.2 mg/dL (ref 0.40–1.50)
GFR: 79.1 mL/min (ref >60.00)
Glucose, Bld: 94 mg/dL (ref 70–99)
Potassium: 4.1 mEq/L (ref 3.5–5.1)
Sodium: 138 mEq/L (ref 135–145)
Total Bilirubin: 0.4 mg/dL (ref 0.2–1.2)
Total Protein: 7.7 g/dL (ref 6.0–8.3)

## 2020-08-25 LAB — URINALYSIS, ROUTINE W REFLEX MICROSCOPIC
Bilirubin Urine: NEGATIVE
Hgb urine dipstick: NEGATIVE
Ketones, ur: NEGATIVE
Nitrite: NEGATIVE
Specific Gravity, Urine: 1.025 (ref 1.000–1.030)
Total Protein, Urine: NEGATIVE
Urine Glucose: NEGATIVE
Urobilinogen, UA: 0.2 (ref 0.0–1.0)
pH: 6 (ref 5.0–8.0)

## 2020-08-25 LAB — LIPID PANEL
Cholesterol: 204 mg/dL — ABNORMAL HIGH (ref 0–200)
HDL: 55.5 mg/dL (ref >39.00)
LDL Cholesterol: 114 mg/dL — ABNORMAL HIGH (ref 0–99)
NonHDL: 148.75
Total CHOL/HDL Ratio: 4
Triglycerides: 172 mg/dL — ABNORMAL HIGH (ref 0.0–149.0)
VLDL: 34.4 mg/dL (ref 0.0–40.0)

## 2020-08-25 LAB — CBC
HCT: 46.1 % (ref 39.0–52.0)
Hemoglobin: 15.1 g/dL (ref 13.0–17.0)
MCHC: 32.7 g/dL (ref 30.0–36.0)
MCV: 87.8 fl (ref 78.0–100.0)
Platelets: 203 10*3/uL (ref 150.0–400.0)
RBC: 5.25 Mil/uL (ref 4.22–5.81)
RDW: 13.1 % (ref 11.5–15.5)
WBC: 6.6 10*3/uL (ref 4.0–10.5)

## 2020-08-25 MED ORDER — LISINOPRIL 20 MG PO TABS: 20.0000 mg | ORAL_TABLET | Freq: Every day | ORAL | 3 refills | Status: AC

## 2020-08-25 MED ORDER — ALBUTEROL SULFATE HFA 108 (90 BASE) MCG/ACT IN AERS: 1.0000 | INHALATION_SPRAY | Freq: Four times a day (QID) | RESPIRATORY_TRACT | 3 refills | Status: DC | PRN

## 2020-08-25 MED ORDER — FLUTICASONE PROPIONATE 50 MCG/ACT NA SUSP
2.0000 | Freq: Every day | NASAL | 6 refills | Status: DC
Start: 2020-08-25 — End: 2021-03-17

## 2020-08-25 MED ORDER — ADVAIR HFA 115-21 MCG/ACT IN AERO
2.0000 | INHALATION_SPRAY | Freq: Two times a day (BID) | RESPIRATORY_TRACT | 12 refills | Status: DC
Start: 2020-08-25 — End: 2020-10-06

## 2020-08-25 NOTE — Progress Notes (Signed)
Established Patient Office Visit  Subjective:  Patient ID: Gary Soto, male    DOB: 03-06-1985  Age: 35 y.o. MRN: [id number]  CC:  Chief Complaint  Patient presents with   Annual Exam    CPE, concerns about breathing issues and snoring at night, SOB issues sometimes.     HPI Gary Soto presents for a complete physical exam and follow-up of hypertension, wheezing, nasal congestion, allergy rhinitis and snoring.  Significant other documents apneic episodes with loud snoring.  Patient with a history of allergy rhinitis he has used Flonase in the past with some success but has not used it in a few months.  He tells of increased wheezing after his COVID infection this past January.  He has no history of asthma.  They do have a cat at home and he wonders if he is allergic to it.  History of hypertension.  Is taking lisinopril in the past with good effect.  Denies problems with it.  Past Medical History:  Diagnosis Date   Hypertension     No past surgical history on file.  Family History  Problem Relation Age of Onset   Hypertension Mother    Hyperlipidemia Mother    Cancer Father    Hypertension Maternal Grandmother    Hyperlipidemia Maternal Grandmother    Diabetes Maternal Grandmother    Heart disease Maternal Grandmother     Social History   Socioeconomic History   Marital status: Single    Spouse name: Not on file   Number of children: Not on file   Years of education: Not on file   Highest education level: Not on file  Occupational History   Not on file  Tobacco Use   Smoking status: Never   Smokeless tobacco: Never  Vaping Use   Vaping Use: Never used  Substance and Sexual Activity   Alcohol use: Yes    Alcohol/week: 1.0 standard drink    Types: 1 Cans of beer per week    Comment: occ   Drug use: Not Currently    Types: Marijuana   Sexual activity: Yes  Other Topics Concern   Not on file  Social History Narrative   Not on file   Social  Determinants of Health   Financial Resource Strain: Not on file  Food Insecurity: Not on file  Transportation Needs: Not on file  Physical Activity: Not on file  Stress: Not on file  Social Connections: Not on file  Intimate Partner Violence: Not on file    Outpatient Medications Prior to Visit  Medication Sig Dispense Refill   albuterol (VENTOLIN HFA) 108 (90 Base) MCG/ACT inhaler INHALE 2 PUFFS INTO THE LUNGS EVERY 6 HOURS AS NEEDED FOR WHEEZING OR SHORTNESS OF BREATH 6.7 g 3   guaiFENesin-codeine 100-10 MG/5ML syrup Take 5 mLs by mouth 2 (two) times daily as needed for cough. 120 mL 0   Misc. Devices (PULSE OXIMETER) MISC Use often to check O2 for COVID 19 1 each 0   ondansetron (ZOFRAN) 8 MG tablet Take 1 tablet (8 mg total) by mouth every 8 (eight) hours as needed for nausea or vomiting. 15 tablet 0   No facility-administered medications prior to visit.    No Known Allergies  ROS Review of Systems  Constitutional:  Negative for diaphoresis, fatigue, fever and unexpected weight change.  HENT:  Positive for congestion, postnasal drip, rhinorrhea and sneezing.   Eyes:  Negative for photophobia and visual disturbance.  Respiratory:  Positive for  apnea, cough and wheezing.   Cardiovascular: Negative.   Gastrointestinal: Negative.   Endocrine: Negative for polyphagia and polyuria.  Genitourinary: Negative.   Musculoskeletal:  Negative for gait problem and joint swelling.  Skin:  Negative for pallor and rash.  Neurological:  Negative for speech difficulty and weakness.  Hematological:  Does not bruise/bleed easily.  Psychiatric/Behavioral: Negative.       Objective:    Physical Exam Vitals and nursing note reviewed.  Constitutional:      General: He is not in acute distress.    Appearance: Normal appearance. He is not ill-appearing, toxic-appearing or diaphoretic.  HENT:     Head: Normocephalic and atraumatic.     Right Ear: Tympanic membrane, ear canal and external ear  normal.     Left Ear: Tympanic membrane and ear canal normal.     Nose:     Right Turbinates: Enlarged and swollen.     Left Turbinates: Enlarged and swollen.     Mouth/Throat:     Mouth: Mucous membranes are moist.     Pharynx: Oropharynx is clear. No oropharyngeal exudate or posterior oropharyngeal erythema.     Tonsils: 3+ on the right. 3+ on the left.  Cardiovascular:     Rate and Rhythm: Normal rate and regular rhythm.  Pulmonary:     Effort: Pulmonary effort is normal.     Breath sounds: Wheezing present.  Abdominal:     General: Abdomen is flat. Bowel sounds are normal. There is no distension.     Palpations: Abdomen is soft. There is no mass.     Tenderness: There is no abdominal tenderness. There is no guarding or rebound.     Hernia: No hernia is present. There is no hernia in the left inguinal area or right inguinal area.  Genitourinary:    Penis: Circumcised. No hypospadias, erythema, tenderness, discharge, swelling or lesions.      Testes:        Right: Mass, tenderness or swelling not present. Right testis is descended.        Left: Mass, tenderness or swelling not present. Left testis is descended.     Epididymis:     Right: Not inflamed or enlarged.     Left: Not inflamed or enlarged.  Musculoskeletal:     Cervical back: No rigidity or tenderness.  Lymphadenopathy:     Cervical: No cervical adenopathy.     Lower Body: No right inguinal adenopathy. No left inguinal adenopathy.  Skin:    General: Skin is warm and dry.  Neurological:     Mental Status: He is alert and oriented to person, place, and time.  Psychiatric:        Mood and Affect: Mood normal.        Behavior: Behavior normal.    BP (!) 144/90 (BP Location: Right Arm, Patient Position: Sitting, Cuff Size: Normal)   Pulse 74   Temp (!) 97.3 F (36.3 C) (Temporal)   Ht 6' (1.829 m)   Wt 249 lb 3.2 oz (113 kg)   SpO2 97%   BMI 33.80 kg/m  Wt Readings from Last 3 Encounters:  08/25/20 249 lb 3.2  oz (113 kg)  01/26/20 245 lb (111.1 kg)  01/22/20 245 lb (111.1 kg)     Health Maintenance Due  Topic Date Due   Hepatitis C Screening  Never done   INFLUENZA VACCINE  08/15/2020    There are no preventive care reminders to display for this patient.  Lab Results  Component Value Date   TSH 2.510 09/14/2017   Lab Results  Component Value Date   WBC 7.4 12/10/2017   HGB 14.9 12/10/2017   HCT 44.9 12/10/2017   MCV 87.2 12/10/2017   PLT 249.0 12/10/2017   Lab Results  Component Value Date   NA 137 12/10/2017   K 3.8 12/10/2017   CO2 23 12/10/2017   GLUCOSE 88 12/10/2017   BUN 21 12/10/2017   CREATININE 0.99 12/10/2017   BILITOT 0.4 12/10/2017   ALKPHOS 51 12/10/2017   AST 21 12/10/2017   ALT 29 12/10/2017   PROT 7.3 12/10/2017   ALBUMIN 4.4 12/10/2017   CALCIUM 9.4 12/10/2017   ANIONGAP 11 09/23/2017   GFR 113.07 12/10/2017   Lab Results  Component Value Date   CHOL 162 12/10/2017   Lab Results  Component Value Date   HDL 58.60 12/10/2017   Lab Results  Component Value Date   LDLCALC 88 12/10/2017   Lab Results  Component Value Date   TRIG 76.0 12/10/2017   Lab Results  Component Value Date   CHOLHDL 3 12/10/2017   Lab Results  Component Value Date   HGBA1C 5.9 (H) 09/14/2017      Assessment & Plan:   Problem List Items Addressed This Visit       Cardiovascular and Mediastinum   Essential hypertension - Primary   Relevant Medications   lisinopril (ZESTRIL) 20 MG tablet   Other Relevant Orders   CBC   Comprehensive metabolic panel   Urinalysis, Routine w reflex microscopic     Respiratory   Tonsillar hypertrophy   Relevant Orders   Ambulatory referral to ENT   Reactive airway disease   Relevant Medications   albuterol (VENTOLIN HFA) 108 (90 Base) MCG/ACT inhaler   fluticasone-salmeterol (ADVAIR HFA) 115-21 MCG/ACT inhaler   Other Relevant Orders   Ambulatory referral to Allergy   Chronic rhinitis   Relevant Orders   Ambulatory  referral to Allergy     Other   Healthcare maintenance   Relevant Orders   Lipid panel   Nasal congestion   Relevant Medications   fluticasone (FLONASE) 50 MCG/ACT nasal spray   Other Relevant Orders   Ambulatory referral to ENT   Ambulatory referral to Allergy   Snores   Relevant Orders   Ambulatory referral to Sleep Studies   Ambulatory referral to ENT    Meds ordered this encounter  Medications   albuterol (VENTOLIN HFA) 108 (90 Base) MCG/ACT inhaler    Sig: Inhale 1-2 puffs into the lungs every 6 (six) hours as needed for wheezing or shortness of breath.    Dispense:  6.7 g    Refill:  3   fluticasone-salmeterol (ADVAIR HFA) 115-21 MCG/ACT inhaler    Sig: Inhale 2 puffs into the lungs 2 (two) times daily.    Dispense:  1 each    Refill:  12   lisinopril (ZESTRIL) 20 MG tablet    Sig: Take 1 tablet (20 mg total) by mouth daily.    Dispense:  90 tablet    Refill:  3   fluticasone (FLONASE) 50 MCG/ACT nasal spray    Sig: Place 2 sprays into both nostrils daily.    Dispense:  16 g    Refill:  6    Follow-up: Return in about 3 months (around 11/25/2020).  Given information on health maintenance and disease prevention.  Also given information on managing hypertension.  Given information on sleep apnea.  Agrees to go for sleep  study.  Given information on allergy rhinitis and reactive airway disease.  Will see allergist and ENT specialist regarding triggers for his symptoms and apnea.  Mliss Sax, MD

## 2020-10-06 ENCOUNTER — Ambulatory Visit (INDEPENDENT_AMBULATORY_CARE_PROVIDER_SITE_OTHER): Payer: Managed Care, Other (non HMO) | Admitting: Allergy & Immunology

## 2020-10-06 ENCOUNTER — Other Ambulatory Visit: Payer: Self-pay

## 2020-10-06 ENCOUNTER — Encounter: Payer: Self-pay | Admitting: Allergy & Immunology

## 2020-10-06 VITALS — BP 124/78 | HR 71 | Temp 98.3°F | Resp 18 | Ht 76.0 in | Wt 246.4 lb

## 2020-10-06 DIAGNOSIS — J310 Chronic rhinitis: Secondary | ICD-10-CM

## 2020-10-06 DIAGNOSIS — R059 Cough, unspecified: Secondary | ICD-10-CM

## 2020-10-06 DIAGNOSIS — J4540 Moderate persistent asthma, uncomplicated: Secondary | ICD-10-CM

## 2020-10-06 MED ORDER — IPRATROPIUM BROMIDE 0.06 % NA SOLN: 1.0000 | Freq: Every day | NASAL | 5 refills | Status: DC

## 2020-10-06 MED ORDER — AIRDUO DIGIHALER 113-14 MCG/ACT IN AEPB: 1.0000 | INHALATION_SPRAY | Freq: Two times a day (BID) | RESPIRATORY_TRACT | 5 refills | Status: DC

## 2020-10-06 MED ORDER — ALBUTEROL SULFATE HFA 108 (90 BASE) MCG/ACT IN AERS: 1.0000 | INHALATION_SPRAY | Freq: Four times a day (QID) | RESPIRATORY_TRACT | 3 refills | Status: DC | PRN

## 2020-10-06 NOTE — Progress Notes (Signed)
`  NEW PATIENT  Date of Service/Encounter:  10/06/20  Consult requested by: Mliss Sax, MD   Assessment:   Cough   Chronic rhinitis   Moderate persistent reactive airway disease without complication  Plan/Recommendations:    1. Cough - Lung testing looked good. - Symptoms are consistent with asthma, but we need to start some medications to see if this helps. - We are sending in a controller medication that should be cheaper than the Advair.  - Daily controller medication(s): AirDuo 113/43mcg one puff twice daily (this will be sent in the mail) - Prior to physical activity: albuterol 2 puffs 10-15 minutes before physical activity. - Rescue medications: albuterol 4 puffs every 4-6 hours as needed - Asthma control goals:  * Full participation in all desired activities (may need albuterol before activity) * Albuterol use two time or less a week on average (not counting use with activity) * Cough interfering with sleep two time or less a month * Oral steroids no more than once a year * No hospitalizations  2. Chronic rhinitis - Testing was negative to the entire panel, but the positive control was also non reactive. - We are going to get some blood work to confirm this. - Add on ipratropium one spray per nostril daily to help decrease runny nose.  3. Return in about 2 months (around 12/06/2020).    This note in its entirety was forwarded to the Provider who requested this consultation.  Subjective:   Gary Soto is a 35 y.o. male presenting today for evaluation of  Chief Complaint  Patient presents with   Other    Had covid with a bad cough. He was given an asthma pump to hel[ with his breathing and still uses the albuterol inhaler every other day.    Allergic Rhinitis     Has been having congestion was given flonase and that has been helping     Gary Soto has a history of the following: Patient Active Problem List   Diagnosis Date Noted    Tonsillar hypertrophy 08/25/2020   Reactive airway disease 08/25/2020   Chronic rhinitis 08/25/2020   Nasal congestion 08/25/2020   Snores 08/25/2020   Seasonal allergic rhinitis due to pollen 09/24/2018   Cough 09/24/2018   Withdrawal complaint 01/24/2018   Hordeolum externum left upper eyelid 01/03/2018   Essential hypertension 12/10/2017   Healthcare maintenance 12/10/2017   MDD (major depressive disorder), severe (HCC) 09/13/2017   Obsessive compulsive personality disorder (HCC)    Severe episode of recurrent major depressive disorder, without psychotic features (HCC)    Substance induced mood disorder (HCC) 11/13/2015    History obtained from: chart review and patient.  Gary Soto was referred by Mliss Sax, MD.     Gary Soto is a 35 y.o. male presenting for an evaluation of breathing problems and environmental allergies .  Asthma/Respiratory Symptom History: Around January 2022, he had COVID 19 and contracted a bad cough. He was having a hard time catching his breath. After that, months later, he was using his pump to catch his breath. He was having coughing spells here and there. He is needing his recuse inhaler every other day. This is mostly at night with the change in the temperatures.  He never had asthma as a child. Albuterol definitely helps with his coughing. He did not have prednisone after having COVID, but he did have a codeine containing cough medicine. He did not go to the hospital, although he  tells me that he probably "should have gone". He was just recently prescribed Advair HFA, but he has not even picked it up since it was $100.   Allergic Rhinitis Symptom History: He does have congestion. He was treated with fluticasone and this helped  with consistent use. He did not have allergies as a child. He does not have a lot of sneezing. He is able to breathe through his nose now. He does have itchy watery eyes and he wears contracts.  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   GERD Symptom History: He did have heart burn for a while. This worsens when he gets off of work around 11pm at night and eats after that before going to bed. This always causes some reflux. He does not take anything daily for reflux. He does not think that the nighttime coughing is related to reflux.   He has hypertension and is on lisinopril. He did NOT have a cough when he started the lisinopril. The COVID infection was clearly the trigger for this.   Otherwise, there is no history of other atopic diseases, including drug allergies, stinging insect allergies, eczema, urticaria, or contact dermatitis. There is no significant infectious history. Vaccinations are up to date.    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 If you have any questions, ask your nurse or doctor.          STOP taking these medications    Advair HFA 115-21 MCG/ACT inhaler Generic drug: fluticasone-salmeterol Replaced by: AirDuo Digihaler 113-14 MCG/ACT Aepb Stopped by: Alfonse Spruce, MD       TAKE these medications     AirDuo Digihaler 113-14 MCG/ACT Aepb Generic drug: Fluticasone-Salmeterol(sensor) Inhale 1 puff into the lungs 2 (two) times daily. Replaces: Advair HFA 115-21 MCG/ACT inhaler Started by: Alfonse Spruce, MD   albuterol 108 (90 Base) MCG/ACT inhaler Commonly known as: VENTOLIN HFA Inhale 1-2 puffs into the lungs every 6 (six) hours as needed for wheezing or shortness of breath.   fluticasone 50 MCG/ACT nasal spray Commonly known as: FLONASE Place 2 sprays into both nostrils daily.   ipratropium 0.06 % nasal spray Commonly known as: ATROVENT Place 1 spray into both nostrils daily. Started by: Alfonse Spruce, MD   lisinopril 20 MG tablet Commonly known as: ZESTRIL Take 1 tablet (20 mg total) by mouth daily.        Birth History: non-contributory  Developmental History: non-contributory  Past Surgical History: History reviewed. No pertinent surgical history.   Family History: Family History  Problem Relation Age of Onset   Hypertension Mother  Hyperlipidemia Mother    Cancer Father    Hypertension Maternal Grandmother    Hyperlipidemia Maternal Grandmother    Diabetes Maternal Grandmother    Heart disease Maternal Grandmother      Social History: Brookes lives at home with his wife and 5 children. He has two cats in the home. The cats have always been around.  There is in a house that is 34 years old.  There is carpeting throughout the home.  They have electric heating and central cooling.  There are 2 cats and 1 dog in the home.  There are no dust mite covers on the bedding.  There is no tobacco exposure.  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  He is exposed to fumes, chemicals, and dust.  He does not use a HEPA filter in the home.  He does not live near an interstate or industrial area.   Review of Systems  Constitutional: Negative.  Negative for chills, fever, malaise/fatigue and weight loss.  HENT:  Positive  for congestion and sinus pain. Negative for ear discharge and ear pain.   Eyes:  Negative for pain, discharge and redness.  Respiratory:  Negative for cough, sputum production, shortness of breath and wheezing.   Cardiovascular: Negative.  Negative for chest pain and palpitations.  Gastrointestinal:  Negative for abdominal pain, heartburn, nausea and vomiting.  Skin: Negative.  Negative for itching and rash.  Neurological:  Negative for dizziness and headaches.  Endo/Heme/Allergies:  Negative for environmental allergies. Does not bruise/bleed easily.      Objective:   Blood pressure 124/78, pulse 71, temperature 98.3 F (36.8 C), resp. rate 18, height 6\' 4"  (1.93 m), weight 246 lb 6.4 oz (111.8 kg), SpO2 97 %. Body mass index is 29.99 kg/m.   Physical Exam:   Physical Exam Vitals reviewed.  Constitutional:      Appearance: He is well-developed.  HENT:     Head: Normocephalic and atraumatic.     Right Ear: Tympanic membrane, ear canal and external ear normal. No drainage, swelling or tenderness. Tympanic membrane is not injected, scarred, erythematous, retracted or bulging.     Left Ear: Tympanic membrane, ear canal and external ear normal. No drainage, swelling or tenderness. Tympanic membrane is not injected, scarred, erythematous, retracted or bulging.     Nose: No nasal deformity, septal deviation, mucosal edema or rhinorrhea.     Right Turbinates: Enlarged and swollen.     Left Turbinates: Enlarged and swollen.     Right Sinus: No maxillary sinus tenderness or frontal sinus tenderness.     Left Sinus: No maxillary sinus tenderness or frontal sinus tenderness.     Mouth/Throat:     Mouth: Mucous membranes are not pale and not dry.     Pharynx: Uvula midline.  Eyes:     General:        Right eye: No discharge.        Left eye: No discharge.     Conjunctiva/sclera: Conjunctivae normal.     Right eye: Right conjunctiva is not injected. No chemosis.    Left eye: Left  conjunctiva is not injected. No chemosis.    Pupils: Pupils are equal, round, and reactive to light.  Cardiovascular:     Rate and Rhythm: Normal rate and regular rhythm.     Heart sounds: Normal heart sounds.  Pulmonary:     Effort: Pulmonary effort is normal. No tachypnea, accessory muscle usage or respiratory distress.  Breath sounds: Normal breath sounds. No wheezing, rhonchi or rales.     Comments: Moving air well in all lung fields.  No increased work of breathing.  Your carbon friend your Lenor Derrick from I sign now I just signed thank you all my Chest:     Chest wall: No tenderness.  Abdominal:     Tenderness: There is no abdominal tenderness. There is no guarding or rebound.  Lymphadenopathy:     Head:     Right side of head: No submandibular, tonsillar or occipital adenopathy.     Left side of head: No submandibular, tonsillar or occipital adenopathy.     Cervical: No cervical adenopathy.  Skin:    Coloration: Skin is not pale.     Findings: No abrasion, erythema, petechiae or rash. Rash is not papular, urticarial or vesicular.  Neurological:     Mental Status: He is alert.  Psychiatric:        Behavior: Behavior is cooperative.     Diagnostic studies:    Spirometry: results normal (FEV1: 3.54/79%, FVC: 4.22/78%, FEV1/FVC: 84%).    Spirometry consistent with normal pattern.    Allergy Studies:     Airborne Adult Perc - 10/06/20 0827     Time Antigen Placed 0827    Allergen Manufacturer Waynette Buttery    Location Back    Number of Test 59    1. Control-Buffer 50% Glycerol Negative    2. Control-Histamine 1 mg/ml Negative    3. Albumin saline Negative    4. Bahia Negative    5. French Southern Territories Negative    6. Renegar Negative    7. Kentucky Blue Negative    8. Meadow Fescue Negative    9. Perennial Rye Negative    10. Sweet Vernal Negative    11. Timothy Negative    12. Cocklebur Negative    13. Burweed Marshelder Negative    14. Ragweed, short Negative    15. Ragweed,  Giant Negative    16. Plantain,  English Negative    17. Lamb's Quarters Negative    18. Sheep Sorrell Negative    19. Rough Pigweed Negative    20. Marsh Elder, Rough Negative    21. Mugwort, Common Negative    22. Ash mix Negative    23. Birch mix Negative    24. Beech American Negative    25. Box, Elder Negative    26. Cedar, red Negative    27. Cottonwood, Guinea[place]issau Negative    28. Elm mix Negative    29. Hickory Negative    30. Maple mix Negative    31. Oak, [place] mix Negative    32. Pecan Pollen Negative    33. Pine mix Negative    34. Sycamore Eastern Negative    35. Walnut, Black Pollen Negative    36. Alternaria alternata Negative    38. Aspergillus mix Negative    39. Penicillium mix Negative    40. Bipolaris sorokiniana (Helminthosporium) Negative    41. Drechslera spicifera (Curvularia) Negative    42. Mucor plumbeus Negative    43. Fusarium moniliforme Negative    44. Aureobasidium pullulans (pullulara) Negative    45. Rhizopus oryzae Negative    46. Botrytis cinera Negative    47. Epicoccum nigrum Negative    48. Phoma betae Negative    49. Candida Albicans Negative    50. Trichophyton mentagrophytes Negative    51. Mite, D Farinae  5,000 AU/ml Negative    52. Mite, D Pteronyssinus  5,000 AU/ml Negative    53. Cat Hair 10,000 BAU/ml Negative    54.  Dog Epithelia Negative    55. Mixed Feathers Negative    56. Horse Epithelia Negative    57. Cockroach, German Negative    58. Mouse Negative    59. Tobacco Leaf Negative             Allergy testing results were read and interpreted by myself, documented by clinical staff.         Malachi Bonds, MD [facility]

## 2020-10-06 NOTE — Patient Instructions (Addendum)
1. Cough - Lung testing looked good. - Symptoms are consistent with asthma, but we need to start some medications to see if this helps. - We are sending in a controller medication that should be cheaper than the Advair.  - Daily controller medication(s): AirDuo 113/15mcg one puff twice daily (this will be sent in the mail) - Prior to physical activity: albuterol 2 puffs 10-15 minutes before physical activity. - Rescue medications: albuterol 4 puffs every 4-6 hours as needed - Asthma control goals:  * Full participation in all desired activities (may need albuterol before activity) * Albuterol use two time or less a week on average (not counting use with activity) * Cough interfering with sleep two time or less a month * Oral steroids no more than once a year * No hospitalizations   2. Chronic rhinitis - Testing was negative to the entire panel, but the positive control was also non reactive. - We are going to get some blood work to confirm this. - Add on ipratropium one spray per nostril daily to help decrease runny nose.  3. Return in about 2 months (around 12/06/2020).    Please inform us of any Emergency Department visits, hospitalizations, or changes in symptoms. Call us before going to the ED for breathing or allergy symptoms since we might be able to fit you in for a sick visit. Feel free to contact us anytime with any questions, problems, or concerns.  It was a pleasure to meet you today!  Websites that have reliable patient information: 1. American Academy of Asthma, Allergy, and Immunology: www.aaaai.org 2. Food Allergy Research and Education (FARE): foodallergy.org 3. Mothers of Asthmatics: http://www.asthmacommunitynetwork.org 4. American College of Allergy, Asthma, and Immunology: www.acaai.org   COVID-19 Vaccine Information can be found at: [email or web address].[email or web address] For questions related to vaccine distribution or  appointments, please email [email or web address] or call [phone].   We realize that you might be concerned about having an allergic reaction to the COVID19 vaccines. To help with that concern, WE ARE OFFERING THE COVID19 VACCINES IN OUR OFFICE! Ask the front desk for dates!     "Like" [place] on Facebook and Instagram for our latest updates!      A healthy democracy works best when [employer] participate! Make sure you are registered to vote! If you have moved or changed any of your contact information, you will need to get this updated before voting!  In some cases, you MAY be able to register to vote online: [email or web address]

## 2020-10-11 ENCOUNTER — Other Ambulatory Visit: Payer: Self-pay

## 2020-10-11 ENCOUNTER — Ambulatory Visit (INDEPENDENT_AMBULATORY_CARE_PROVIDER_SITE_OTHER): Payer: Managed Care, Other (non HMO) | Admitting: Otolaryngology

## 2020-10-11 DIAGNOSIS — R0683 Snoring: Secondary | ICD-10-CM | POA: Diagnosis not present

## 2020-10-11 DIAGNOSIS — J310 Chronic rhinitis: Secondary | ICD-10-CM

## 2020-10-11 DIAGNOSIS — J351 Hypertrophy of tonsils: Secondary | ICD-10-CM | POA: Diagnosis not present

## 2020-10-11 NOTE — Progress Notes (Signed)
HPI: Gary Soto is a 35 y.o. male who presents is referred by his PCP for evaluation of snoring and questionable sleep apnea.  On exam he had large tonsils.  He has also had nasal congestion but is presently using Flonase and doing better since using the Flonase.  Of note he has put on about 20 pounds over the past 2 years and is presently weighing approximately 245 pounds.  His goal is to get down to about 200 to 210 pounds.  He is scheduled for a sleep test on October 25.Marland Kitchen  Past Medical History:  Diagnosis Date   Hypertension    No past surgical history on file. Social History   Socioeconomic History   Marital status: Single    Spouse name: Not on file   Number of children: Not on file   Years of education: Not on file   Highest education level: Not on file  Occupational History   Not on file  Tobacco Use   Smoking status: Never   Smokeless tobacco: Never  Vaping Use   Vaping Use: Never used  Substance and Sexual Activity   Alcohol use: Yes    Alcohol/week: 1.0 standard drink    Types: 1 Cans of beer per week    Comment: occ   Drug use: Not Currently    Types: Marijuana   Sexual activity: Yes  Other Topics Concern   Not on file  Social History Narrative   Not on file   Social Determinants of Health   Financial Resource Strain: Not on file  Food Insecurity: Not on file  Transportation Needs: Not on file  Physical Activity: Not on file  Stress: Not on file  Social Connections: Not on file   Family History  Problem Relation Age of Onset   Hypertension Mother    Hyperlipidemia Mother    Cancer Father    Hypertension Maternal Grandmother    Hyperlipidemia Maternal Grandmother    Diabetes Maternal Grandmother    Heart disease Maternal Grandmother    No Known Allergies Prior to Admission medications   Medication Sig Start Date End Date Taking? Authorizing Provider  albuterol (VENTOLIN HFA) 108 (90 Base) MCG/ACT inhaler Inhale 1-2 puffs into the lungs every 6  (six) hours as needed for wheezing or shortness of breath. 10/06/20   Alfonse Spruce, MD  fluticasone [facility]) 50 MCG/ACT nasal spray Place 2 sprays into both nostrils daily. 08/25/20   Mliss Sax, MD  Fluticasone-Salmeterol,sensor, [facility]) [phone] MCG/ACT AEPB Inhale 1 puff into the lungs 2 (two) times daily. 10/06/20   Alfonse Spruce, MD  ipratropium (ATROVENT) 0.06 % nasal spray Place 1 spray into both nostrils daily. 10/06/20   Alfonse Spruce, MD  lisinopril (ZESTRIL) 20 MG tablet Take 1 tablet (20 mg total) by mouth daily. 08/25/20   Mliss Sax, MD     Positive ROS: Otherwise negative  All other systems have been reviewed and were otherwise negative with the exception of those mentioned in the HPI and as above.  Physical Exam: Constitutional: Alert, well-appearing, no acute distress Ears: External ears without lesions or tenderness. Ear canals are clear bilaterally with intact, clear TMs.  Nasal: External nose without lesions. Septum relatively midline with mild rhinitis.  No polyps noted.  Nasal passages were clear bilaterally..  Oral: Lips and gums without lesions. Tongue and palate mucosa without lesions. Posterior oropharynx clear.  Tonsils are generous size 2-3+ bilaterally. Neck: No palpable adenopathy or masses Respiratory: Breathing comfortably  Skin: No facial/neck lesions or rash noted.  Procedures  Assessment: Moderate rhinitis. Questionable obstructive sleep apnea with sleep test pending. Tonsillar hypertrophy.  Plan: Reviewed with patient concerning regular use of Flonase 2 sprays each nostril at night as this will help with nasal congestion.  Also reviewed with her concerning weight loss that will help with snoring and sleep apnea. Briefly discussed the option of tonsillectomy with him and the morbidity associated with tonsillectomy.  He would have to be out of work for 10 to 14 days. Would not recommend scheduling  tonsillectomy till after he gets results of the sleep test. Since I am retiring at the end of October suggested follow-up with [facility] or Dr. Suszanne Conners concerning results of his sleep test and possible tonsillectomy.   Narda Bonds, MD   CC:

## 2020-10-13 LAB — ALLERGENS W/COMP RFLX AREA 2
Alternaria Alternata IgE: <0.1 kU/L
Aspergillus Fumigatus IgE: <0.1 kU/L
Bermuda Grass IgE: <0.1 kU/L
Cedar, Mountain IgE: <0.1 kU/L
Cladosporium Herbarum IgE: <0.1 kU/L
Cockroach, German IgE: <0.1 kU/L
Common Silver Birch IgE: <0.1 kU/L
Cottonwood IgE: <0.1 kU/L
D Farinae IgE: <0.1 kU/L
D Pteronyssinus IgE: <0.1 kU/L
E001-IgE Cat Dander: 36.4 kU/L — AB
E005-IgE Dog Dander: 4.07 kU/L — AB
Elm, American IgE: <0.1 kU/L
IgE (Immunoglobulin E), Serum: 110 IU/mL (ref 6–495)
Johnson Grass IgE: <0.1 kU/L
Maple/Box Elder IgE: <0.1 kU/L
Mouse Urine IgE: <0.1 kU/L
Oak, White IgE: <0.1 kU/L
Pecan, Hickory IgE: <0.1 kU/L
Penicillium Chrysogen IgE: <0.1 kU/L
Pigweed, Rough IgE: <0.1 kU/L
Ragweed, Short IgE: <0.1 kU/L
Sheep Sorrel IgE Qn: <0.1 kU/L
Timothy Grass IgE: <0.1 kU/L
White Mulberry IgE: <0.1 kU/L

## 2020-10-13 LAB — PANEL 606578
E094-IgE Fel d 1: 21.7 kU/L — AB
E220-IgE Fel d 2: <0.1 kU/L
E228-IgE Fel d 4: 12.2 kU/L — AB

## 2020-10-13 LAB — ALLERGEN COMPONENT COMMENTS

## 2020-10-13 LAB — PANEL 606648
E101-IgE Can f 1: 2.1 kU/L — AB
E102-IgE Can f 2: <0.1 kU/L
E221-IgE Can f 3: <0.1 kU/L
E226-IgE Can f 5: <0.1 kU/L

## 2020-11-08 ENCOUNTER — Encounter: Payer: Self-pay | Admitting: Neurology

## 2020-11-08 ENCOUNTER — Institutional Professional Consult (permissible substitution): Payer: Managed Care, Other (non HMO) | Admitting: Neurology

## 2020-12-07 ENCOUNTER — Ambulatory Visit: Payer: Managed Care, Other (non HMO) | Admitting: Surgery

## 2020-12-13 ENCOUNTER — Ambulatory Visit: Payer: Managed Care, Other (non HMO) | Admitting: Allergy & Immunology

## 2020-12-13 DIAGNOSIS — J309 Allergic rhinitis, unspecified: Secondary | ICD-10-CM

## 2020-12-22 ENCOUNTER — Ambulatory Visit (INDEPENDENT_AMBULATORY_CARE_PROVIDER_SITE_OTHER): Payer: Managed Care, Other (non HMO) | Admitting: Urology

## 2020-12-22 ENCOUNTER — Encounter: Payer: Self-pay | Admitting: Urology

## 2020-12-22 ENCOUNTER — Other Ambulatory Visit: Payer: Self-pay

## 2020-12-22 VITALS — BP 153/113 | HR 72 | Ht 73.0 in | Wt 240.0 lb

## 2020-12-22 DIAGNOSIS — Z3009 Encounter for other general counseling and advice on contraception: Secondary | ICD-10-CM

## 2020-12-22 MED ORDER — DIAZEPAM 10 MG PO TABS: ORAL_TABLET | ORAL | 0 refills | Status: AC

## 2020-12-22 NOTE — Progress Notes (Signed)
12/22/2020 10:34 AM   Gary Soto [birth year] [id number]  Referring provider: Mliss Sax, MD [address] [place],  [place] [address]  Chief Complaint  Patient presents with   VAS Consult    HPI: Gary Soto is a 35 y.o. male who presents for vasectomy counseling.  Married with 5 children Denies prior history urologic problems including chronic scrotal pain, epididymitis or orchitis No previous history inguinal hernia or pelvic surgery No history of bleeding or clotting disorders   PMH: Past Medical History:  Diagnosis Date   Hypertension     Surgical History: No past surgical history on file.  Home Medications:  Allergies as of 12/22/2020   No Known Allergies      Medication List        Accurate as of December 22, 2020 10:34 AM. If you have any questions, ask your nurse or doctor.          AirDuo Digihaler 113-14 MCG/ACT Aepb Generic drug: Fluticasone-Salmeterol(sensor) Inhale 1 puff into the lungs 2 (two) times daily.   albuterol 108 (90 Base) MCG/ACT inhaler Commonly known as: VENTOLIN HFA Inhale 1-2 puffs into the lungs every 6 (six) hours as needed for wheezing or shortness of breath.   fluticasone 50 MCG/ACT nasal spray Commonly known as: FLONASE Place 2 sprays into both nostrils daily.   ipratropium 0.06 % nasal spray Commonly known as: ATROVENT Place 1 spray into both nostrils daily.   lisinopril 20 MG tablet Commonly known as: ZESTRIL Take 1 tablet (20 mg total) by mouth daily.        Allergies: No Known Allergies  Family History: Family History  Problem Relation Age of Onset   Hypertension Mother    Hyperlipidemia Mother    Cancer Father    Hypertension Maternal Grandmother    Hyperlipidemia Maternal Grandmother    Diabetes Maternal Grandmother    Heart disease Maternal Grandmother     Social History:  reports that he has never smoked. He has never used smokeless tobacco. He reports current  alcohol use of about 1.0 standard drink per week. He reports that he does not currently use drugs after having used the following drugs: Marijuana.   Physical Exam: BP (!) 153/113   Pulse 72   Ht 6\' 1"  (1.854 m)   Wt 240 lb (108.9 kg)   BMI 31.66 kg/m   Constitutional:  Alert and oriented, No acute distress. HEENT: [place] AT, moist mucus membranes.  Trachea midline, no masses. Cardiovascular: No clubbing, cyanosis, or edema. Respiratory: Normal respiratory effort, no increased work of breathing. GI: Abdomen is soft, nontender, nondistended, no abdominal masses GU: Phallus without lesions, testes descended bilaterally without masses or tenderness, spermatic cord/epididymis palpably normal bilaterally.  Vasa easily palpable bilaterally Skin: No rashes, bruises or suspicious lesions. Neurologic: Grossly intact, no focal deficits, moving all 4 extremities. Psychiatric: Normal mood and affect.   Assessment & Plan:    1.  Undesired fertility Desires to schedule vasectomy We had a long discussion about vasectomy. We specifically discussed the procedure, recovery and the risks, benefits and alternatives of vasectomy. I explained that the procedure entails removal of a segment of each vas deferens, each of which conducts sperm, and that the purpose of this procedure is to cause sterility (inability to produce children or cause pregnancy). Vasectomy is intended to be permanent and irreversible form of contraception. Options for fertility after vasectomy include vasectomy reversal, or sperm retrieval with in vitro fertilization. These options are not always  successful, and they may be expensive. We discussed reversible forms of birth control such as condoms, IUD or diaphragms, as well as the option of freezing sperm in a sperm bank prior to the vasectomy procedure. We discussed the importance of avoiding strenuous exercise for four days after vasectomy, and the importance of refraining from any form of  ejaculation for seven days after vasectomy. I explained that vasectomy does not produce immediate sterility so another form of contraceptive must be used until sterility is assured by having semen checked for sperm. Thus, a post vasectomy semen analysis is necessary to confirm sterility. Rarely, vasectomy must be repeated. We discussed the approximately 1 in 2,000 risk of pregnancy after vasectomy for men who have post-vasectomy semen analysis showing absent sperm or rare non-motile sperm. Typical side effects include a small amount of oozing blood, some discomfort and mild swelling in the area of incision.  Vasectomy does not affect sexual performance, function, please, sensation, interest, desire, satisfaction, penile erection, volume of semen or ejaculation. Other rare risks include allergy or adverse reaction to an anesthetic, testicular atrophy, hematoma, infection/abscess, prolonged tenderness of the vas deferens, pain, swelling, painful nodule or scar (called sperm granuloma) or epididymtis. We discussed chronic testicular pain syndrome. This has been reported to occur in as many as 1-2% of men and may be permanent. This can be treated with medication, small procedures or (rarely) surgery. Valium 10 mg as a preprocedure anxiolytic sent to pharmacy and he was informed he would need a driver if utilizing this medication    Riki Altes, MD  [facility] [address], [address] [place], [place] [address] [phone]

## 2020-12-22 NOTE — Patient Instructions (Signed)

## 2021-01-19 ENCOUNTER — Encounter: Payer: Self-pay | Admitting: Urology

## 2021-01-19 ENCOUNTER — Ambulatory Visit (INDEPENDENT_AMBULATORY_CARE_PROVIDER_SITE_OTHER): Payer: Managed Care, Other (non HMO) | Admitting: Urology

## 2021-01-19 ENCOUNTER — Other Ambulatory Visit: Payer: Self-pay

## 2021-01-19 VITALS — BP 148/70 | HR 90 | Ht 73.0 in | Wt 240.0 lb

## 2021-01-19 DIAGNOSIS — Z302 Encounter for sterilization: Secondary | ICD-10-CM | POA: Diagnosis not present

## 2021-01-19 MED ORDER — HYDROCODONE-ACETAMINOPHEN 5-325 MG PO TABS: 1.0000 | ORAL_TABLET | Freq: Four times a day (QID) | ORAL | 0 refills | Status: DC | PRN

## 2021-01-19 NOTE — Patient Instructions (Signed)

## 2021-01-19 NOTE — Progress Notes (Signed)
Vasectomy Procedure Note  Indications: CAILLOU MINUS is a 36 y.o. male who presents today for elective sterilization.  He has been consented for the procedure.  He is aware of the risks and benefits.  He had no additional questions.  He agrees to proceed.  He denies any other significant change since his last visit.  Pre-operative Diagnosis: Elective sterilization  Post-operative Diagnosis: Elective sterilization  Premedication: Valium 10 mg po  Surgeon: Lorin Picket C. Dyllan Kats, M.D  Description: The patient was prepped and draped in the standard fashion.  The right vas deferens was identified and brought superiorly to the anterior scrotal skin.  The skin and vas were then anesthetized utilizing 6 ml 1% lidocaine.  A small stab incision was made and spread with the vas dissector.  The vas was grasped utilizing the vas clamp and elevated out of the incision.  The vas was dissected free from surrounding tissue and vessels and an ~1 cm segment was excised.  The vas lumens were cauterized utilizing electrocautery.  The distal segment was buried in the surrounding sheath with a 3-0 chromic suture.  No significant bleeding was observed.  The vas ends were then dropped back into the hemiscrotum.  The skin was closed with hemostatic pressure.  An identical procedure was performed on the contralateral side.  Clean dry gauze was applied to the incision sites.  The patient tolerated the procedure well.  Complications:None  Recommendations: 1.  No lifting greater than 10 pounds or strenuousactivity for 1 week. 2.  Scrotal support for 1 week. 3.  Shower only for 1 week; may shower in the morning 4.  May resume intercourse in one week if no significant discomfort.  Continue alternate contraception for 12 weeks.  5.  Call for significant pain, swelling, redness, drainage or fever greater than 100.5. 6.  Rx hydrocodone/APAP 5/325 1-2 every 6 hours as needed for pain. 7.  Follow-up semen analysis in 12  weeks.   Irineo Axon, MD

## 2021-01-22 ENCOUNTER — Encounter: Payer: Self-pay | Admitting: Urology

## 2021-01-25 ENCOUNTER — Telehealth: Payer: Self-pay | Admitting: Urology

## 2021-01-25 NOTE — Telephone Encounter (Signed)
Tried to call patient back . No answer left voice mail

## 2021-01-25 NOTE — Telephone Encounter (Signed)
Patient had a vasectomy with Dr. Lonna Cobb on 1/5.  He called the office today and left a voice mail message that he is experiencing pain on the left side.  I attempted to call him to back to triage his symptoms, but he did not answer.  I left him a message to call the office so we could discuss his symptoms to determine if an office visit appointment with Dr. Lonna Cobb is needed.

## 2021-03-17 ENCOUNTER — Encounter: Payer: Self-pay | Admitting: Emergency Medicine

## 2021-03-17 ENCOUNTER — Other Ambulatory Visit: Payer: Self-pay

## 2021-03-17 ENCOUNTER — Ambulatory Visit
Admission: EM | Admit: 2021-03-17 | Discharge: 2021-03-17 | Disposition: A | Discharge status: Home / Self Care | Payer: Managed Care, Other (non HMO) | Attending: Urgent Care | Admitting: Urgent Care

## 2021-03-17 DIAGNOSIS — Z202 Contact with and (suspected) exposure to infections with a predominantly sexual mode of transmission: Secondary | ICD-10-CM | POA: Insufficient documentation

## 2021-03-17 MED ORDER — DOXYCYCLINE HYCLATE 100 MG PO CAPS: 100.0000 mg | ORAL_CAPSULE | Freq: Two times a day (BID) | ORAL | 0 refills | Status: AC

## 2021-03-17 NOTE — ED Triage Notes (Signed)
Pt here with a known exposure to Chlamydia a week ago. Pt does not have any sx.  ?

## 2021-03-17 NOTE — ED Provider Notes (Signed)
?[facility] ? ? ? ?CSN: [id number] ?Arrival date & time: 03/17/21  [id number] ? ? ?  ? ?History   ?Chief Complaint ?Chief Complaint  ?Patient presents with  ? STI check  ? ? ?HPI ?Gary Soto is a 36 y.o. male.  ? ?Pleasant 36yo male presents today requesting treatment for chlamydia. He states his most recent partner notified him last evening that she just tested positive for chlamydia. His last exposure to her was 1.5 weeks ago. Pt currently denies any symptoms - denies dysuria, discharge, pain, swelling, rash or ulcerations. Pt denies fever, flank pain or hematuria. He is at his baseline health. Denies need for additional testing. ? ? ? ?Past Medical History:  ?Diagnosis Date  ? Hypertension   ? ? ?Patient Active Problem List  ? Diagnosis Date Noted  ? Tonsillar hypertrophy 08/25/2020  ? Reactive airway disease 08/25/2020  ? Chronic rhinitis 08/25/2020  ? Nasal congestion 08/25/2020  ? Snores 08/25/2020  ? Seasonal allergic rhinitis due to pollen 09/24/2018  ? Cough 09/24/2018  ? Withdrawal complaint 01/24/2018  ? Hordeolum externum left upper eyelid 01/03/2018  ? Essential hypertension 12/10/2017  ? Healthcare maintenance 12/10/2017  ? MDD (major depressive disorder), severe ([place]) 09/13/2017  ? 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 Severe episode of recurrent major depressive disorder, without psychotic features ([place])   ? Substance induced mood disorder ([place]) 11/13/2015  ? ? ?History reviewed. No pertinent surgical history. ? ? ? ? ?Home Medications   ? ?Prior to Admission medications   ?Medication Sig Start Date End Date Taking? Authorizing Provider  ?doxycycline (VIBRAMYCIN) 100 MG capsule Take 1 capsule (100 mg total) by mouth 2 (two) times daily for 7 days. 03/17/21 03/24/21 Yes Keilin Gamboa L, PA  ?diazepam (VALIUM) 10 MG tablet 1 tab po 30 min prior to procedure 12/22/20   Stoioff, Ronda Fairly, MD  ?lisinopril (ZESTRIL) 20 MG tablet Take 1 tablet (20 mg total) by mouth daily. 08/25/20    Libby Maw, MD  ? ? ?Family History ?Family History  ?Problem Relation Age of Onset  ? Hypertension Mother   ? Hyperlipidemia Mother   ? Cancer Father   ? Hypertension Maternal Grandmother   ? Hyperlipidemia Maternal Grandmother   ? Diabetes Maternal Grandmother   ? Heart disease Maternal Grandmother   ? ? ?Social History ?Social History  ? ?Tobacco Use  ? Smoking status: Never  ? Smokeless tobacco: Never  ?Vaping Use  ? Vaping Use: Never used  ?Substance Use Topics  ? Alcohol use: Yes  ?  Alcohol/week: 1.0 standard drink  ?  Types: 1 Cans of beer per week  ?  Comment: occ  ? Drug use: Not Currently  ?  Types: Marijuana  ? ? ? ?Allergies   ?Patient has no known allergies. ? ? ?Review of Systems ?Review of Systems  ?All other systems reviewed and are negative. ? ? ?Physical Exam ?Triage Vital Signs ?ED Triage Vitals  ?Enc Vitals Group  ?   BP 03/17/21 0850 (!) 150/97  ?   Pulse Rate 03/17/21 0850 80  ?   Resp 03/17/21 0850 18  ?   Temp 03/17/21 0850 98.6 ?F (37 ?C)  ?   Temp Source 03/17/21 0850 Oral  ?   SpO2 03/17/21 0850 95 %  ?   Weight --   ?   Height --   ?   Head Circumference --   ?   Peak Flow --   ?  Pain Score 03/17/21 0853 0  ?   Pain Loc --   ?   Pain Edu? --   ?   Excl. in [place]? --   ? ?No data found. ? ?Updated Vital Signs ?BP (!) 150/97 (BP Location: Right Arm)   Pulse 80   Temp 98.6 ?F (37 ?C) (Oral)   Resp 18   SpO2 95%  ? ?Visual Acuity ?Right Eye Distance:   ?Left Eye Distance:   ?Bilateral Distance:   ? ?Right Eye Near:   ?Left Eye Near:    ?Bilateral Near:    ? ?Physical Exam ?Vitals and nursing note reviewed.  ?Constitutional:   ?   General: He is not in acute distress. ?   Appearance: Normal appearance. He is well-developed. He is obese. He is not ill-appearing or diaphoretic.  ?HENT:  ?   Head: Normocephalic and atraumatic.  ?   Right Ear: External ear normal.  ?   Left Ear: External ear normal.  ?Eyes:  ?   Extraocular Movements: Extraocular movements intact.  ?   Pupils:  Pupils are equal, round, and reactive to light.  ?Cardiovascular:  ?   Rate and Rhythm: Normal rate and regular rhythm.  ?Pulmonary:  ?   Effort: Pulmonary effort is normal.  ?Abdominal:  ?   General: Abdomen is flat. There is no distension.  ?   Palpations: Abdomen is soft.  ?   Tenderness: There is no abdominal tenderness. There is no right CVA tenderness, left CVA tenderness or guarding.  ?Musculoskeletal:     ?   General: No swelling.  ?   Cervical back: Normal range of motion and neck supple.  ?Skin: ?   General: Skin is warm and dry.  ?   Capillary Refill: Capillary refill takes less than 2 seconds.  ?Neurological:  ?   General: No focal deficit present.  ?   Mental Status: He is alert and oriented to person, place, and time.  ?Psychiatric:     ?   Mood and Affect: Mood normal.  ? ? ? ?UC Treatments / Results  ?Labs ?(all labs ordered are listed, but only abnormal results are displayed) ?Labs Reviewed  ?CYTOLOGY, (ORAL, ANAL, URETHRAL) ANCILLARY ONLY  ? ? ?EKG ? ? ?Radiology ?No results found. ? ?Procedures ?Procedures (including critical care time) ? ?Medications Ordered in UC ?Medications - No data to display ? ?Initial Impression / Assessment and Plan / UC Course  ?I have reviewed the triage vital signs and the nursing notes. ? ?Pertinent labs & imaging results that were available during my care of the patient were reviewed by me and considered in my medical decision making (see chart for details). ? ?  ? ?Exposure to chlamydia - will start empiric treatment with doxycycline BID x 7 days. Pt to avoid all forms of intercourse until all antibiotics completed, and all partners antibiotics also completed. Will call with results of Aptima swab, treating any additional positives once results available. STI lab tests deferred. ? ?Final Clinical Impressions(s) / UC Diagnoses  ? ?Final diagnoses:  ?Exposure to chlamydia  ? ? ? ?Discharge Instructions   ? ?  ?Due to your known exposure to chlamydia, it is important  you start treatment right away. ? ?Start taking the antibiotic twice daily, do not stop taking it just because you feel better. Monitor for any adverse reactions. ?Do not take it within two hours of milk consumption, and avoid multivitamins while on the antibiotic. ?Please avoid excessive sun  exposure or tanning beds while taking. ?Drink plenty of water.  ? ?You were tested today for gonorrhea, chlamydia, & trichomonas. ?We will call you with the results of your test once received. ?Please avoid all forms of intercourse until test results received, and if positive for any STI, all partners will need to complete entire course of antibiotics prior to resuming. ?As always, practice safer sexual practices by using protection each and every time, and limiting number of partners.   ? ? ? ?ED Prescriptions   ? ? Medication Sig Dispense Auth. Provider  ? doxycycline (VIBRAMYCIN) 100 MG capsule Take 1 capsule (100 mg total) by mouth 2 (two) times daily for 7 days. 14 capsule Harun Brumley L, PA  ? ?  ? ?PDMP not reviewed this encounter. ?  Chaney Malling, [place] ?03/17/21 0915 ? ?

## 2021-03-17 NOTE — Discharge Instructions (Addendum)
Due to your known exposure to chlamydia, it is important you start treatment right away. ? ?Start taking the antibiotic twice daily, do not stop taking it just because you feel better. Monitor for any adverse reactions. ?Do not take it within two hours of milk consumption, and avoid multivitamins while on the antibiotic. ?Please avoid excessive sun exposure or tanning beds while taking. ?Drink plenty of water.  ? ?You were tested today for gonorrhea, chlamydia, & trichomonas. ?We will call you with the results of your test once received. ?Please avoid all forms of intercourse until test results received, and if positive for any STI, all partners will need to complete entire course of antibiotics prior to resuming. ?As always, practice safer sexual practices by using protection each and every time, and limiting number of partners.   ?

## 2021-03-20 LAB — CYTOLOGY, (ORAL, ANAL, URETHRAL) ANCILLARY ONLY
Chlamydia: NEGATIVE
Comment: NEGATIVE
Comment: NEGATIVE
Comment: NORMAL
Neisseria Gonorrhea: NEGATIVE
Trichomonas: NEGATIVE

## 2021-04-17 ENCOUNTER — Ambulatory Visit
Admission: EM | Admit: 2021-04-17 | Discharge: 2021-04-17 | Disposition: A | Discharge status: Home / Self Care | Payer: Managed Care, Other (non HMO)

## 2021-04-17 ENCOUNTER — Encounter: Payer: Self-pay | Admitting: Emergency Medicine

## 2021-04-17 DIAGNOSIS — H1033 Unspecified acute conjunctivitis, bilateral: Secondary | ICD-10-CM | POA: Diagnosis not present

## 2021-04-17 DIAGNOSIS — B349 Viral infection, unspecified: Secondary | ICD-10-CM | POA: Diagnosis not present

## 2021-04-17 NOTE — ED Triage Notes (Addendum)
Pt presents with bilateral pink eye x 3 days. Pt had an e-visit 2 days ago was prescribed drops, but they're not helping.  ?

## 2021-04-17 NOTE — Discharge Instructions (Addendum)
Continue using the antibiotic eyedrops as prescribed.  Follow-up with your primary care provider if your symptoms are not improving.    ? ?Your COVID and Flu tests are pending.  You should self quarantine until the test results are back.   ? ?Take Tylenol or ibuprofen as needed for fever or discomfort.  Rest and keep yourself hydrated.   ? ?Follow-up with your primary care provider if your symptoms are not improving.   ? ? ? ? ?

## 2021-04-17 NOTE — ED Provider Notes (Signed)
?[facility] ? ? ? ?CSN: [id number] ?Arrival date & time: 04/17/21  0946 ? ? ?  ? ?History   ?Chief Complaint ?Chief Complaint  ?Patient presents with  ? Conjunctivitis  ? ? ?HPI ?Gary Soto is a 36 y.o. male.  Patient presents with 3-day history of bilateral eye redness and drainage.  He had a E-visit yesterday and was prescribed gentamicin eyedrops.  He also reports fever, ear fullness, nasal congestion, postnasal drip.  Treatment at home with Tylenol.  He denies rash, sore throat, cough, shortness of breath, vomiting, diarrhea, or other symptoms.  Patient was seen at this urgent care on 03/17/2021; diagnosed with exposure to chlamydia; treated with doxycycline.  His medical history includes hypertension, substance-induced mood disorder, severe depression, obsessive-compulsive personality disorder, seasonal allergies, chronic rhinitis. ? ?The history is provided by the patient and medical records.  ? ?Past Medical History:  ?Diagnosis Date  ? Hypertension   ? ? ?Patient Active Problem List  ? Diagnosis Date Noted  ? Tonsillar hypertrophy 08/25/2020  ? Reactive airway disease 08/25/2020  ? Chronic rhinitis 08/25/2020  ? Nasal congestion 08/25/2020  ? Snores 08/25/2020  ? Seasonal allergic rhinitis due to pollen 09/24/2018  ? Cough 09/24/2018  ? Withdrawal complaint 01/24/2018  ? Hordeolum externum left upper eyelid 01/03/2018  ? Essential hypertension 12/10/2017  ? Healthcare maintenance 12/10/2017  ? MDD (major depressive disorder), severe (HCC) 09/13/2017  ? Obsessive compulsive personality disorder (HCC)   ? Severe episode of recurrent major depressive disorder, without psychotic features (HCC)   ? Substance induced mood disorder (HCC) 11/13/2015  ? ? ?History reviewed. No pertinent surgical history. ? ? ? ? ?Home Medications   ? ?Prior to Admission medications   ?Medication Sig Start Date End Date Taking? Authorizing Provider  ?lisinopril (ZESTRIL) 20 MG tablet Take 1 tablet (20 mg total) by mouth  daily. 08/25/20  Yes Mliss Sax, MD  ?diazepam (VALIUM) 10 MG tablet 1 tab po 30 min prior to procedure 12/22/20   Riki Altes, MD  ?gentamicin (GARAMYCIN) 0.3 % ophthalmic solution SMARTSIG:In Eye(s) 04/16/21   [provider]  ? ? ?Family History ?Family History  ?Problem Relation Age of Onset  ? Hypertension Mother   ? Hyperlipidemia Mother   ? Cancer Father   ? Hypertension Maternal Grandmother   ? Hyperlipidemia Maternal Grandmother   ? Diabetes Maternal Grandmother   ? Heart disease Maternal Grandmother   ? ? ?Social History ?Social History  ? ?Tobacco Use  ? Smoking status: Never  ? Smokeless tobacco: Never  ?Vaping Use  ? Vaping Use: Never used  ?Substance Use Topics  ? Alcohol use: Yes  ?  Alcohol/week: 1.0 standard drink  ?  Types: 1 Cans of beer per week  ?  Comment: occ  ? Drug use: Not Currently  ?  Types: Marijuana  ? ? ? ?Allergies   ?Patient has no known allergies. ? ? ?Review of Systems ?Review of Systems  ?Constitutional:  Positive for fever. Negative for chills.  ?HENT:  Positive for congestion, ear pain, postnasal drip and rhinorrhea. Negative for sore throat.   ?Eyes:  Positive for discharge and redness. Negative for pain and visual disturbance.  ?Respiratory:  Negative for cough and shortness of breath.   ?Cardiovascular:  Negative for chest pain and palpitations.  ?Gastrointestinal:  Negative for diarrhea and vomiting.  ?Skin:  Negative for color change and rash.  ?All other systems reviewed and are negative. ? ? ?Physical Exam ?Triage Vital  Signs ?ED Triage Vitals  ?Enc Vitals Group  ?   BP 04/17/21 1019 (!) 142/88  ?   Pulse Rate 04/17/21 1019 97  ?   Resp 04/17/21 1019 18  ?   Temp 04/17/21 1019 98.6 ?F (37 ?C)  ?   Temp src --   ?   SpO2 04/17/21 1019 97 %  ?   Weight --   ?   Height --   ?   Head Circumference --   ?   Peak Flow --   ?   Pain Score 04/17/21 1040 0  ?   Pain Loc --   ?   Pain Edu? --   ?   Excl. in GC? --   ? ?No data found. ? ?Updated Vital Signs ?BP  (!) 142/88   Pulse 97   Temp 98.6 ?F (37 ?C)   Resp 18   SpO2 97%  ? ?Visual Acuity ?Right Eye Distance: 20/25 (corrected) ?Left Eye Distance: 20/25 (corrected) ?Bilateral Distance: 20/25 (corrected) ? ?Right Eye Near:   ?Left Eye Near:    ?Bilateral Near:    ? ?Physical Exam ?Vitals and nursing note reviewed.  ?Constitutional:   ?   General: He is not in acute distress. ?   Appearance: Normal appearance. He is well-developed. He is not ill-appearing.  ?HENT:  ?   Right Ear: Tympanic membrane normal.  ?   Left Ear: Tympanic membrane normal.  ?   Nose: Nose normal.  ?   Mouth/Throat:  ?   Mouth: Mucous membranes are moist.  ?   Pharynx: Oropharynx is clear.  ?Eyes:  ?   General: Lids are normal. Vision grossly intact.  ?   Extraocular Movements: Extraocular movements intact.  ?   Conjunctiva/sclera:  ?   Right eye: Right conjunctiva is injected.  ?   Left eye: Left conjunctiva is injected.  ?   Pupils: Pupils are equal, round, and reactive to light.  ?Cardiovascular:  ?   Rate and Rhythm: Normal rate and regular rhythm.  ?   Heart sounds: Normal heart sounds.  ?Pulmonary:  ?   Effort: Pulmonary effort is normal. No respiratory distress.  ?   Breath sounds: Normal breath sounds.  ?Musculoskeletal:  ?   Cervical back: Neck supple.  ?Skin: ?   General: Skin is warm and dry.  ?Neurological:  ?   Mental Status: He is alert.  ?Psychiatric:     ?   Mood and Affect: Mood normal.     ?   Behavior: Behavior normal.  ? ? ? ?UC Treatments / Results  ?Labs ?(all labs ordered are listed, but only abnormal results are displayed) ?Labs Reviewed  ?COVID-19, FLU A+B NAA  ? ? ?EKG ? ? ?Radiology ?No results found. ? ?Procedures ?Procedures (including critical care time) ? ?Medications Ordered in UC ?Medications - No data to display ? ?Initial Impression / Assessment and Plan / UC Course  ?I have reviewed the triage vital signs and the nursing notes. ? ?Pertinent labs & imaging results that were available during my care of the  patient were reviewed by me and considered in my medical decision making (see chart for details). ? ?Viral illness, conjunctivitis.  Instructed patient to continue the gentamicin eyedrops that he was prescribed yesterday.  COVID and Flu pending.  Instructed patient to self quarantine per CDC guidelines.  Discussed symptomatic treatment including Tylenol or ibuprofen, rest, hydration.  Instructed patient to follow up with PCP if symptoms are  not improving.  Patient agrees to plan of care. ? ? ?Final Clinical Impressions(s) / UC Diagnoses  ? ?Final diagnoses:  ?Viral illness  ?Acute conjunctivitis of both eyes, unspecified acute conjunctivitis type  ? ? ? ?Discharge Instructions   ? ?  ?Continue using the antibiotic eyedrops as prescribed.  Follow-up with your primary care provider if your symptoms are not improving.    ? ?Your COVID and Flu tests are pending.  You should self quarantine until the test results are back.   ? ?Take Tylenol or ibuprofen as needed for fever or discomfort.  Rest and keep yourself hydrated.   ? ?Follow-up with your primary care provider if your symptoms are not improving.   ? ? ? ? ? ? ? ? ?ED Prescriptions   ?None ?  ? ?PDMP not reviewed this encounter. ?  ?Mickie Bail, NP ?04/17/21 1108 ? ?

## 2021-04-19 ENCOUNTER — Other Ambulatory Visit: Payer: Self-pay | Admitting: *Deleted

## 2021-04-19 DIAGNOSIS — Z9852 Vasectomy status: Secondary | ICD-10-CM

## 2021-04-19 LAB — COVID-19, FLU A+B NAA
Influenza A, NAA: NOT DETECTED
Influenza B, NAA: NOT DETECTED
SARS-CoV-2, NAA: NOT DETECTED

## 2021-04-20 ENCOUNTER — Other Ambulatory Visit: Payer: Managed Care, Other (non HMO)

## 2021-04-20 DIAGNOSIS — Z9852 Vasectomy status: Secondary | ICD-10-CM

## 2021-04-22 LAB — POST-VAS SPERM EVALUATION,QUAL: Volume: 1.1 mL

## 2021-04-24 ENCOUNTER — Telehealth: Payer: Self-pay | Admitting: *Deleted

## 2021-04-24 NOTE — Telephone Encounter (Signed)
Patient need to come by the office to pick up papers from [facility]  ? ?

## 2021-04-24 NOTE — Telephone Encounter (Signed)
-----   Message from Abbie Sons, MD sent at 04/23/2021 12:43 PM EDT ----- ?Sperm were present in 3 month postvasectomy sample.  This particular test does not determine if sperm are motile or immotile (alive or dead).  The presence of motile sperm indicates vasectomy failure where immotile sperm does not.  Please schedule postvasectomy sample that includes count and motility determination.  Have patient continue alternate contraception. ? ?

## 2021-04-24 NOTE — Telephone Encounter (Signed)
Notified patient as instructed,.
